# Patient Record
Sex: Female | Born: 1970 | Race: White | Hispanic: No | Marital: Married | Smoking: Never smoker
Health system: Southern US, Community
[De-identification: ages and names within clinical notes are randomized; demographics above are authoritative.]

## PROBLEM LIST (undated history)

## (undated) DIAGNOSIS — J189 Pneumonia, unspecified organism: Secondary | ICD-10-CM

## (undated) DIAGNOSIS — K529 Noninfective gastroenteritis and colitis, unspecified: Secondary | ICD-10-CM

## (undated) DIAGNOSIS — D369 Benign neoplasm, unspecified site: Secondary | ICD-10-CM

## (undated) DIAGNOSIS — J40 Bronchitis, not specified as acute or chronic: Secondary | ICD-10-CM

## (undated) DIAGNOSIS — I10 Essential (primary) hypertension: Secondary | ICD-10-CM

## (undated) DIAGNOSIS — E669 Obesity, unspecified: Secondary | ICD-10-CM

## (undated) DIAGNOSIS — J329 Chronic sinusitis, unspecified: Secondary | ICD-10-CM

## (undated) DIAGNOSIS — E039 Hypothyroidism, unspecified: Secondary | ICD-10-CM

## (undated) HISTORY — DX: Bronchitis, not specified as acute or chronic: J32.9

## (undated) HISTORY — PX: SHOULDER SURGERY: SHX246

## (undated) HISTORY — DX: Noninfective gastroenteritis and colitis, unspecified: K52.9

## (undated) HISTORY — DX: Benign neoplasm, unspecified site: D36.9

## (undated) HISTORY — PX: BREAST ENHANCEMENT SURGERY: SHX7

## (undated) HISTORY — DX: Hypothyroidism, unspecified: E03.9

## (undated) HISTORY — DX: Bronchitis, not specified as acute or chronic: J40

## (undated) HISTORY — DX: Pneumonia, unspecified organism: J18.9

## (undated) HISTORY — PX: TENDON RELEASE: SHX230

## (undated) HISTORY — DX: Obesity, unspecified: E66.9

## (undated) HISTORY — PX: OTHER SURGICAL HISTORY: SHX169

## (undated) HISTORY — PX: FINGER SURGERY: SHX640

## (undated) HISTORY — PX: KNEE SURGERY: SHX244

---

## 1998-04-07 ENCOUNTER — Other Ambulatory Visit: Admission: RE | Admit: 1998-04-07 | Discharge: 1998-04-07 | Payer: Self-pay | Admitting: Obstetrics and Gynecology

## 1998-05-13 ENCOUNTER — Ambulatory Visit (HOSPITAL_COMMUNITY): Admission: RE | Admit: 1998-05-13 | Discharge: 1998-05-13 | Payer: Self-pay | Admitting: Sports Medicine

## 1998-05-13 ENCOUNTER — Encounter: Payer: Self-pay | Admitting: Sports Medicine

## 1998-09-01 ENCOUNTER — Other Ambulatory Visit: Admission: RE | Admit: 1998-09-01 | Discharge: 1998-09-01 | Payer: Self-pay | Admitting: Obstetrics and Gynecology

## 1998-10-12 ENCOUNTER — Other Ambulatory Visit: Admission: RE | Admit: 1998-10-12 | Discharge: 1998-10-12 | Payer: Self-pay | Admitting: Obstetrics and Gynecology

## 1999-04-13 ENCOUNTER — Other Ambulatory Visit: Admission: RE | Admit: 1999-04-13 | Discharge: 1999-04-13 | Payer: Self-pay | Admitting: Obstetrics and Gynecology

## 1999-07-15 ENCOUNTER — Other Ambulatory Visit: Admission: RE | Admit: 1999-07-15 | Discharge: 1999-07-15 | Payer: Self-pay | Admitting: Obstetrics and Gynecology

## 1999-10-12 ENCOUNTER — Other Ambulatory Visit: Admission: RE | Admit: 1999-10-12 | Discharge: 1999-10-12 | Payer: Self-pay | Admitting: Obstetrics and Gynecology

## 2000-04-12 ENCOUNTER — Other Ambulatory Visit: Admission: RE | Admit: 2000-04-12 | Discharge: 2000-04-12 | Payer: Self-pay | Admitting: Obstetrics and Gynecology

## 2000-10-04 ENCOUNTER — Other Ambulatory Visit: Admission: RE | Admit: 2000-10-04 | Discharge: 2000-10-04 | Payer: Self-pay | Admitting: Obstetrics and Gynecology

## 2001-04-05 ENCOUNTER — Other Ambulatory Visit: Admission: RE | Admit: 2001-04-05 | Discharge: 2001-04-05 | Payer: Self-pay | Admitting: Obstetrics and Gynecology

## 2001-10-03 ENCOUNTER — Other Ambulatory Visit: Admission: RE | Admit: 2001-10-03 | Discharge: 2001-10-03 | Payer: Self-pay | Admitting: *Deleted

## 2002-10-14 ENCOUNTER — Other Ambulatory Visit: Admission: RE | Admit: 2002-10-14 | Discharge: 2002-10-14 | Payer: Self-pay | Admitting: *Deleted

## 2003-10-23 ENCOUNTER — Other Ambulatory Visit: Admission: RE | Admit: 2003-10-23 | Discharge: 2003-10-23 | Payer: Self-pay | Admitting: *Deleted

## 2004-06-03 ENCOUNTER — Ambulatory Visit: Payer: Self-pay | Admitting: Internal Medicine

## 2004-10-26 ENCOUNTER — Other Ambulatory Visit: Admission: RE | Admit: 2004-10-26 | Discharge: 2004-10-26 | Payer: Self-pay | Admitting: *Deleted

## 2005-08-04 ENCOUNTER — Ambulatory Visit: Payer: Self-pay | Admitting: Internal Medicine

## 2005-08-10 ENCOUNTER — Ambulatory Visit: Payer: Self-pay | Admitting: Internal Medicine

## 2007-05-09 ENCOUNTER — Ambulatory Visit: Payer: Self-pay | Admitting: Internal Medicine

## 2007-07-06 DIAGNOSIS — J45991 Cough variant asthma: Secondary | ICD-10-CM | POA: Insufficient documentation

## 2007-07-06 DIAGNOSIS — J3089 Other allergic rhinitis: Secondary | ICD-10-CM

## 2007-07-06 DIAGNOSIS — J302 Other seasonal allergic rhinitis: Secondary | ICD-10-CM | POA: Insufficient documentation

## 2007-07-09 ENCOUNTER — Ambulatory Visit: Payer: Self-pay | Admitting: Internal Medicine

## 2007-07-10 ENCOUNTER — Encounter: Admission: RE | Admit: 2007-07-10 | Discharge: 2007-07-10 | Payer: Self-pay | Admitting: Gastroenterology

## 2007-07-16 ENCOUNTER — Encounter: Payer: Self-pay | Admitting: Internal Medicine

## 2007-07-31 ENCOUNTER — Ambulatory Visit: Payer: Self-pay | Admitting: Internal Medicine

## 2007-08-24 ENCOUNTER — Ambulatory Visit: Payer: Self-pay | Admitting: Internal Medicine

## 2007-09-07 ENCOUNTER — Ambulatory Visit: Payer: Self-pay | Admitting: Internal Medicine

## 2007-09-17 ENCOUNTER — Ambulatory Visit: Payer: Self-pay | Admitting: Internal Medicine

## 2007-10-16 ENCOUNTER — Ambulatory Visit: Payer: Self-pay | Admitting: Internal Medicine

## 2008-01-22 ENCOUNTER — Ambulatory Visit: Payer: Self-pay | Admitting: Internal Medicine

## 2008-01-24 ENCOUNTER — Ambulatory Visit: Payer: Self-pay | Admitting: Internal Medicine

## 2008-05-15 ENCOUNTER — Ambulatory Visit (HOSPITAL_COMMUNITY): Admission: RE | Admit: 2008-05-15 | Discharge: 2008-05-15 | Payer: Self-pay | Admitting: Gastroenterology

## 2008-06-16 ENCOUNTER — Ambulatory Visit: Payer: Self-pay | Admitting: Internal Medicine

## 2008-07-24 ENCOUNTER — Ambulatory Visit: Payer: Self-pay | Admitting: Internal Medicine

## 2008-11-18 ENCOUNTER — Ambulatory Visit: Payer: Self-pay | Admitting: Internal Medicine

## 2009-06-24 ENCOUNTER — Ambulatory Visit: Payer: Self-pay | Admitting: Internal Medicine

## 2009-08-20 ENCOUNTER — Ambulatory Visit: Payer: Self-pay | Admitting: Internal Medicine

## 2010-04-02 ENCOUNTER — Telehealth (INDEPENDENT_AMBULATORY_CARE_PROVIDER_SITE_OTHER): Payer: Self-pay | Admitting: *Deleted

## 2010-04-06 ENCOUNTER — Ambulatory Visit: Payer: Self-pay | Admitting: Internal Medicine

## 2010-07-29 NOTE — Assessment & Plan Note (Signed)
Summary: ROV//1 YEAR/MBW   Primary Provider/Referring Provider:  Hildred Chase  CC:  follow up visit.  History of Present Illness: ASTHMA (ICD-493.90) ASTHMA, INTERMITTENT, MILD (ICD-493.90) ALLERGIC RHINITIS (ICD-477.9)  7/090 40 YO woman returning for f/u of asthma and allergic rhinitis. She gets her allergy vaccine injections at work 1:50, with no problems.Using Veramyst. Patanase is too harsh on her nose. Asthma control has been good with no exacerbations.  07/24/08- Asthma, allergic rhinitis Moved up to 1:10 on vaccine with shots still given at work- doing very well. Has had good 2-3 months. No colds this winter. Had both flu vax. Discussed saline nasal spray, conversion to 3 month mail order scripts, and rescue inhalers.  August 20, 2009- Asthma, allergic rhinitis Father Barbee Cough died at home- COPD and DM. Allergy vacine continues to help, given 1:10 at work. No problems. Has taken Sudafed last few days for stuffiness. Rarely needs Proair. Had flu shot.   Current Medications (verified): 1)  Apri Birth Control .Marland Kitchen.. 1 By Mouth Daily 2)  Singulair 10 Mg  Tabs (Montelukast Sodium) .Marland Kitchen.. 1 By Mouth Daily 3)  Multivitamins   Tabs (Multiple Vitamin) .Marland Kitchen.. 1 By Mouth Daily 4)  Allegra 180 Mg  Tabs (Fexofenadine Hcl) .Marland Kitchen.. 1 By Mouth Daily 5)  Epipen 2-Pak 0.3 Mg/0.2ml (1:1000)  Devi (Epinephrine Hcl (Anaphylaxis)) .... For Severe Allergic Reaction 6)  Allergy Vaccine Go To 1:10, At Work 7)  Colestipol Hcl 1 Gm Tabs (Colestipol Hcl) .... Two Times A Day 8)  Proair Hfa 108 (90 Base) Mcg/act Aers (Albuterol Sulfate) .... 2 Puffs Four Times A Day As Needed  Allergies (verified): 1)  ! Codeine 2)  * Noxema  Past History:  Past Medical History: Last updated: 07/09/2007 Allergic Rhinitis Asthma Pneumonia x2 chronic recurrent sinusitis  Past Surgical History: Last updated: 07/09/2007 breast augmentation R knee surgery tendon release R hand R shoulder  Family History: Last  updated: 08/20/2009 Mother - had lung cancer Father died asthma/emphysema, DM  Social History: Last updated: 07/24/2008 Patient never smoked. Grew up in smoking household  Risk Factors: Smoking Status: never (07/24/2008)  Family History: Mother - had lung cancer Father died asthma/emphysema, DM  Review of Systems      See HPI  The patient denies anorexia, fever, weight loss, weight gain, vision loss, decreased hearing, hoarseness, chest pain, syncope, dyspnea on exertion, peripheral edema, prolonged cough, headaches, hemoptysis, abdominal pain, and severe indigestion/heartburn.    Vital Signs:  Patient profile:   40 year old female Height:      63.5 inches Weight:      155.38 pounds BMI:     27.19 O2 Sat:      99 % on Room air Pulse rate:   111 / minute BP sitting:   122 / 80  (left arm) Cuff size:   regular  Vitals Entered By: Reynaldo Minium CMA (August 20, 2009 2:56 PM)  O2 Flow:  Room air  Physical Exam  Additional Exam:  General: A/Ox3; pleasant and cooperative, NAD, well-appearing SKIN: no rash, lesions NODES: no lymphadenopathy HEENT: [place]/AT, EOM- WNL, Conjuctivae- clear, PERRLA, TM-WNL, Nose- clear, Throat- clear and wnl, sniffing and minor hoarseness, not red [place]  II NECK: Supple w/ fair ROM, JVD- none, normal carotid impulses w/o bruits Thyroid- normal to palpation CHEST: Clear to P&A HEART: RRR, no m/g/r heard, pulse runs a little rapid- again noted ABDOMEN: Soft and nl;  WUJ:WJXB, nl pulses, no edema  NEURO: Grossly intact to observation  Impression & Recommendations:  Problem # 1:  ASTHMA, INTERMITTENT, MILD (ICD-493.90) Rescue inhaler use is appropriate and sufficient  Problem # 2:  ALLERGIC RHINITIS (ICD-477.9)  She believes vaccine is worth continuing. We discussed supplemental meds available if needed. Allegra is going otc. Her updated medication list for this problem includes:    Allegra 180 Mg Tabs (Fexofenadine hcl) .Marland Kitchen... 1  by mouth daily  Medications Added to Medication List This Visit: 1)  Epipen 0.3 Mg/0.52ml Devi (Epinephrine) .... For severe allergic reaction  Other Orders: Est. Patient Level III (16109)  Patient Instructions: 1)  Schedule return in one year, earlier if needed 2)  Meds refilled 3)  continue allergy vaccine Prescriptions: EPIPEN 0.3 MG/0.3ML DEVI (EPINEPHRINE) For severe allergic reaction  #1 x prn   Entered and Authorized by:   Waymon Budge MD   Signed by:   Waymon Budge MD on 08/20/2009   Method used:   Print then Give to Patient   RxID:   [id number] ALLEGRA 180 MG  TABS (FEXOFENADINE HCL) 1 by mouth daily  #90 x 3   Entered and Authorized by:   Waymon Budge MD   Signed by:   Waymon Budge MD on 08/20/2009   Method used:   Print then Give to Patient   RxID:   [id number] SINGULAIR 10 MG  TABS (MONTELUKAST SODIUM) 1 by mouth daily  #90 x 3   Entered and Authorized by:   Waymon Budge MD   Signed by:   Waymon Budge MD on 08/20/2009   Method used:   Print then Give to Patient   RxID:   [id number]

## 2010-07-29 NOTE — Progress Notes (Signed)
Summary: allergy problem  Phone Note Call from Patient Call back at [phone] ex [phone]   Caller: Patient Call For: young Summary of Call: need to talk to nurse about allergy problem. Initial call taken by: Rickard Patience,  April 02, 2010 11:08 AM  Follow-up for Phone Call        called and spoke with pt.  pt states she received a call the first of this week from the allergy lab regarding her serum.  Will forward message to the allergy lab.  Aundra Millet Reynolds LPN  April 02, 2010 11:33 AM   Additional Follow-up for Phone Call Additional follow up Details #1::        Talked to pt. this morning;her last shot was a week ago.Her last vac. was 06-24-09. She didn't start taking it till 09-14-2009;indicating we sent more vac. in early March. We have no record of this. Do want [place] to start her at a lower strength & dose or just a lower dose? She is currently taking 0.5 of 1:10.  Additional Follow-up by: Dimas Millin,  April 05, 2010 10:46 AM    Additional Follow-up for Phone Call Additional follow up Details #2::    I would have her stay on 1:10, and 1 week intervals. Drop down to 0.1 ml/ vial and rebuild from that.  Follow-up by: Waymon Budge MD,  April 05, 2010 12:24 PM

## 2010-08-20 ENCOUNTER — Encounter: Payer: Self-pay | Admitting: Internal Medicine

## 2010-08-20 ENCOUNTER — Ambulatory Visit (INDEPENDENT_AMBULATORY_CARE_PROVIDER_SITE_OTHER): Payer: BC Managed Care – PPO | Admitting: Internal Medicine

## 2010-08-20 DIAGNOSIS — J309 Allergic rhinitis, unspecified: Secondary | ICD-10-CM

## 2010-08-20 DIAGNOSIS — J45909 Unspecified asthma, uncomplicated: Secondary | ICD-10-CM

## 2010-09-02 NOTE — Assessment & Plan Note (Signed)
Summary: 1 year//mbw   Primary Provider/Referring Provider:  Hildred Alamin  CC:  Follow up visit-asthma and allergies; Recent Bronchitis-cough non productive still hanging around..  History of Present Illness:  07/24/08- Asthma, allergic rhinitis Moved up to 1:10 on vaccine with shots still given at work- doing very well. Has had good 2-3 months. No colds this winter. Had both flu vax. Discussed saline nasal spray, conversion to 3 month mail order scripts, and rescue inhalers.  August 20, 2009- Asthma, allergic rhinitis Father Barbee Cough died at home- COPD and DM. Allergy vacine continues to help, given 1:10 at work. No problems. Has taken Sudafed last few days for stuffiness. Rarely needs Proair. Had flu shot.  August 20, 2010- Asthma, allergic rhinitis Nurse-CC: Follow up visit-asthma and allergies; Recent Bronchitis-cough non productive still hanging around. Allergy vaccine 1:10 GO- given at work. 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 They help- fewer sinus infections and related problems. Uses nasal saline rinse when needed.  Asthma- minimal nagging cough since sick a month ago. Maybe some postnasal drip.       Asthma History    Initial Asthma Severity Rating:    Age range: 12+ years    Symptoms: 0-2 days/week    Nighttime Awakenings: 0-2/month    Interferes w/ normal activity: no limitations    SABA use (not for EIB): 0-2 days/week    Asthma Severity Assessment: Intermittent   Preventive Screening-Counseling & Management  Alcohol-Tobacco     Smoking Status: never  Current Medications (verified): 1)  Apri Birth Control .Marland Kitchen.. 1 By Mouth Daily 2)  Singulair 10 Mg  Tabs (Montelukast Sodium) .Marland Kitchen.. 1 By Mouth Daily 3)  Multivitamins   Tabs (Multiple Vitamin) .Marland Kitchen.. 1 By Mouth Daily 4)  Allegra 180 Mg  Tabs (Fexofenadine Hcl) .Marland Kitchen.. 1 By Mouth Daily 5)  Epipen 0.3 Mg/0.40ml Devi (Epinephrine) .... For Severe Allergic  Reaction 6)  Allergy Vaccine Go To 1:10, At Work 7)  Proair Hfa 108 (90 Base) Mcg/act Aers (Albuterol Sulfate) .... 2 Puffs Four Times A Day As Needed  Allergies (verified): 1)  ! Codeine 2)  * Noxema  Past History:  Past Medical History: Last updated: 07/09/2007 Allergic Rhinitis Asthma Pneumonia x2 chronic recurrent sinusitis  Past Surgical History: Last updated: 07/09/2007 breast augmentation R knee surgery tendon release R hand R shoulder  Family History: Last updated: 08/20/2009 Mother - had lung cancer Father died asthma/emphysema, DM  Social History: Last updated: 07/24/2008 Patient never smoked. Grew up in smoking household  Risk Factors: Smoking Status: never (08/20/2010)  Review of Systems      See HPI       The patient complains of nasal congestion/difficulty breathing through nose and sneezing.  The patient denies shortness of breath with activity, shortness of breath at rest, productive cough, non-productive cough, coughing up blood, chest pain, irregular heartbeats, acid heartburn, indigestion, loss of appetite, weight change, abdominal pain, difficulty swallowing, sore throat, tooth/dental problems, and headaches.    Vital Signs:  Patient profile:   40 year old female Height:      63.5 inches Weight:      169.25 pounds BMI:     29.62 O2 Sat:      99 % on Room air Pulse rate:   116 / minute BP sitting:   136 / 90  (right arm) Cuff size:   regular  Vitals Entered By: Reynaldo Minium CMA (August 20, 2010 2:18 PM)  O2 Flow:  Room air CC: Follow up visit-asthma and allergies; Recent Bronchitis-cough non productive still hanging around.   Physical Exam  Additional Exam:  General: A/Ox3; pleasant and cooperative, NAD, well-appearing SKIN: no rash, lesions NODES: no lymphadenopathy HEENT: [place]/AT, EOM- WNL, Conjuctivae- clear, PERRLA, TM-hearing aids, Nose- clear, Throat- clear and wnl, sniffing and minor hoarseness,   Mallampati  II, some red and  cobblestoned. NECK: Supple w/ fair ROM, JVD- none, normal carotid impulses w/o bruits Thyroid- normal to palpation CHEST: Clear to P&A HEART: RRR, no m/g/r heard, pulse runs a little rapid- again noted ABDOMEN: Soft and nl;  JWJ:XBJY, nl pulses, no edema  NEURO: Grossly intact to observation      Impression & Recommendations:  Problem # 1:  ALLERGIC RHINITIS (ICD-477.9)  Fairly good control for the season. We discussed choices among antihistamines. She will continue allergy shots.  Some of what she interprets in her throat as postnasal drip may be irritation from reflux. she has recently begun Zantac.  Her updated medication list for this problem includes:    Allegra 180 Mg Tabs (Fexofenadine hcl) .Marland Kitchen... 1 by mouth daily  Orders: Est. Patient Level III (78295)  Problem # 2:  ASTHMA (ICD-493.90) Good control   Patient Instructions: 1)  Please schedule a follow-up appointment in 1 year. 2)  continue allergy vaccine- please call if any questions or concerns 3)  Ok to use antihistamine of choice 4)  Meds refilled Prescriptions: PROAIR HFA 108 (90 BASE) MCG/ACT AERS (ALBUTEROL SULFATE) 2 puffs four times a day as needed  #1 x prn   Entered and Authorized by:   Waymon Budge MD   Signed by:   Waymon Budge MD on 08/20/2010   Method used:   Print then Give to Patient   RxID:   [id number] EPIPEN 0.3 MG/0.3ML DEVI (EPINEPHRINE) For severe allergic reaction  #1 x prn   Entered and Authorized by:   Waymon Budge MD   Signed by:   Waymon Budge MD on 08/20/2010   Method used:   Print then Give to Patient   RxID:   [id number] SINGULAIR 10 MG  TABS (MONTELUKAST SODIUM) 1 by mouth daily  #90 x 3   Entered and Authorized by:   Waymon Budge MD   Signed by:   Waymon Budge MD on 08/20/2010   Method used:   Print then Give to Patient   RxID:   [id number]

## 2010-11-09 NOTE — Assessment & Plan Note (Signed)
[facility]                             PULMONARY OFFICE NOTE   Evelyn, Chase                     MRN:          [id number]  DATE:05/09/2007                            DOB:          30-Oct-1970    PROBLEM:  A 40 year old woman with a longstanding history of allergic  rhinitis and asthma wanting to establish here.  She was on allergy  vaccine for about 7 years with Dr. Lucie Leather and then off for about a year  during which time she began again having recurrent sinus congestion and  sinus infections.  She then transferred to Dr. Adela Lank for 3 years.  Her  office nurse has been giving her shots.  She has an EpiPen at work.  There have been no problems with reactions to her vaccine.  Most recent  skin testing about 3 or 4 years ago she remembers positive for molds,  cat, and dust.  She notes seasonal rhinorrhea and nasal congestion,  spring and fall, with postnasal drip which triggers cough.  Metered  inhaler has been some help.  Her most significant trigger has been house  dust recently.  Occasional need for albuterol, maybe 3 or 4 times a  month.   MEDICATIONS:  1. Loestrin birth control pills.  2. Allergy vaccine from Dr. Valinda Party office.  3. Singulair 10 mg.  4. Detrol LA 4 mg.  5. Allegra 180 mg.  6. Patanase nasal spray.  7. Ambien p.r.n.  8. Albuterol inhaler p.r.n.   ALLERGIES:  DRUG INTOLERANT TO CODEINE.   REVIEW OF SYSTEMS:  Exertional dyspnea, nonproductive cough, sore  throats, headaches, nasal congestion especially at night, sneezing,  little reflux.   PAST MEDICAL HISTORY:  1. Two episodes of pneumonia.  2. No ENT surgery.  3. Asthma.  4. Allergic rhinitis.  5. Chronic headaches.  6. Childhood otitis.  7. No history of urticaria, but she avoids Noxema which makes her skin      flush.  8. Breast augmentation in 2005.  9. Bilateral TM joint problems.  10.Right knee surgery.  11.Tenon release on right thumb and ring  finger.  12.Surgery on right shoulder.  13.Wisdom teeth extraction.   No intolerance to latex, contrast dye, or aspirin, but she avoids MSG in  restaurant food which causes GI upset.  Otherwise, no food or insect  sting sensitivity.   SOCIAL HISTORY:  Never smoked.  Occasional social alcohol.  Husband  smokes outside.  No children.  She works as a Psychologist, sport and exercise.   FAMILY HISTORY:  Father with allergies, asthma, and emphysema.  He  smoked.  Mother with lung cancer.   ENVIRONMENTAL:  She lives in a house with a crawl space, heat pump, one  dog.  There are  on bedding.  Carpet in bedroom is to be removed;  otherwise, hardwood.  They have an air cleaner in the bedroom.   OBJECTIVE:  VITAL SIGNS:  Weight 154 pounds.  Blood pressure 116/80,  pulse 87 and regular, room air saturation 87%.  HEENT:  Sniffing.  Narrow nasal airway with some mucous bridging.  No  visible polyps.  Pharynx is clear.  Voice quality normal.  NECK:  No neck vein distension or stridor.  CHEST:  Clear without cough or wheeze.  Good diaphragmatic excursion.  CARDIAC:  Heart sounds regular without murmur or gallop.  There is no  hepatosplenomegaly, no adenopathy or edema.   IMPRESSION:  1. Allergic rhinitis.  2. Mild intermittent asthma.   PLAN:  Add Veramyst nasal spray once or twice daily.  She will return as  able off of antihistamines for skin testing for her wish to transfer her  allergy vaccine management here.     Clinton D. Maple Hudson, MD, Tonny Bollman, FACP  Electronically Signed    CDY/MedQ  DD: 05/13/2007  DT: 05/14/2007  Job #: [id number]   cc:   Tammy R. Collins [place], M.D.

## 2010-11-09 NOTE — Op Note (Signed)
NAME:  Evelyn Chase, Evelyn Chase              ACCOUNT NO.:  [id number]   MEDICAL RECORD NO.:  [id number]          PATIENT TYPE:  AMB   LOCATION:  ENDO                         FACILITY:  [facility]   PHYSICIAN:  Petra Kuba, M.D.    DATE OF BIRTH:  February 16, 1971   DATE OF PROCEDURE:  DATE OF DISCHARGE:                               OPERATIVE REPORT   PROCEDURE:  Colonoscopy with biopsy.   INDICATION:  Patient with chronic diarrhea, nondiagnostic biopsies in  the past, but also with a polyp with high-grade dysplasia.  I want to  repeat colonoscopy for screening.  Consent was signed after risks,  benefits, methods, options thoroughly discussed multiple times in the  past.   MEDICINES USED PER ANESTHESIA:  Diprivan 225, Versed 2 mg, fentanyl 250  mcg.   PROCEDURE:  Rectal inspection is pertinent for small external  hemorrhoids.  Digital exam was negative.  The video pediatric  colonoscope was inserted with some difficulty due to a tortuous looping  colon.  With abdominal pressure, it was able to be advanced into the  cecum which was identified by the appendiceal orifice and ileocecal  valve.  No abnormalities were seen on insertion.  The scope was inserted  a short way into the terminal ileum which was normal.  Photo  documentation was obtained.  The scope was slowly withdrawn.  On slow  withdrawal through the colon, no polyps, tumors, masses, diverticula, or  signs of colitis were seen.  As we slowly withdrew back to the rectum,  anorectal pull-through and retroflexion confirmed some small  hemorrhoids.  The scope was straightened and readvanced towards the left  side of the colon.  Air was suctioned and the scope removed.  The  patient tolerated the procedure well.  There was no obvious immediate  complication.   ENDOSCOPIC DIAGNOSES:  1. Internal and external hemorrhoids.  2. Tortuous colon.  3. Otherwise within normal limits to the terminal ileum.   PLAN:  Recheck colon screening in 3 years.   Continue present management.  She seems to be improved.  Follow up p.r.n. or in 3 months.           ______________________________  Petra Kuba, M.D.     MEM/MEDQ  D:  05/15/2008  T:  05/15/2008  Job:  [id number]   cc:   Tammy R. Collins [place], M.D.  Fax: [phone]

## 2010-11-29 ENCOUNTER — Ambulatory Visit (INDEPENDENT_AMBULATORY_CARE_PROVIDER_SITE_OTHER): Payer: BC Managed Care – PPO

## 2010-11-29 DIAGNOSIS — J309 Allergic rhinitis, unspecified: Secondary | ICD-10-CM

## 2010-12-20 ENCOUNTER — Ambulatory Visit (INDEPENDENT_AMBULATORY_CARE_PROVIDER_SITE_OTHER): Payer: BC Managed Care – PPO

## 2010-12-20 DIAGNOSIS — J309 Allergic rhinitis, unspecified: Secondary | ICD-10-CM

## 2010-12-27 ENCOUNTER — Ambulatory Visit (INDEPENDENT_AMBULATORY_CARE_PROVIDER_SITE_OTHER): Payer: BC Managed Care – PPO

## 2010-12-27 DIAGNOSIS — J309 Allergic rhinitis, unspecified: Secondary | ICD-10-CM

## 2011-01-04 ENCOUNTER — Ambulatory Visit (INDEPENDENT_AMBULATORY_CARE_PROVIDER_SITE_OTHER): Payer: BC Managed Care – PPO

## 2011-01-04 DIAGNOSIS — J309 Allergic rhinitis, unspecified: Secondary | ICD-10-CM

## 2011-01-10 ENCOUNTER — Ambulatory Visit (INDEPENDENT_AMBULATORY_CARE_PROVIDER_SITE_OTHER): Payer: BC Managed Care – PPO

## 2011-01-10 DIAGNOSIS — J309 Allergic rhinitis, unspecified: Secondary | ICD-10-CM

## 2011-01-18 ENCOUNTER — Ambulatory Visit (INDEPENDENT_AMBULATORY_CARE_PROVIDER_SITE_OTHER): Payer: BC Managed Care – PPO

## 2011-01-18 DIAGNOSIS — J309 Allergic rhinitis, unspecified: Secondary | ICD-10-CM

## 2011-01-25 ENCOUNTER — Ambulatory Visit (INDEPENDENT_AMBULATORY_CARE_PROVIDER_SITE_OTHER): Payer: BC Managed Care – PPO

## 2011-01-25 DIAGNOSIS — J309 Allergic rhinitis, unspecified: Secondary | ICD-10-CM

## 2011-01-31 ENCOUNTER — Ambulatory Visit (INDEPENDENT_AMBULATORY_CARE_PROVIDER_SITE_OTHER): Payer: BC Managed Care – PPO

## 2011-01-31 DIAGNOSIS — J309 Allergic rhinitis, unspecified: Secondary | ICD-10-CM

## 2011-02-07 ENCOUNTER — Ambulatory Visit (INDEPENDENT_AMBULATORY_CARE_PROVIDER_SITE_OTHER): Payer: BC Managed Care – PPO

## 2011-02-07 DIAGNOSIS — J309 Allergic rhinitis, unspecified: Secondary | ICD-10-CM

## 2011-02-14 ENCOUNTER — Ambulatory Visit (INDEPENDENT_AMBULATORY_CARE_PROVIDER_SITE_OTHER): Payer: BC Managed Care – PPO

## 2011-02-14 DIAGNOSIS — J309 Allergic rhinitis, unspecified: Secondary | ICD-10-CM

## 2011-02-21 ENCOUNTER — Ambulatory Visit (INDEPENDENT_AMBULATORY_CARE_PROVIDER_SITE_OTHER): Payer: BC Managed Care – PPO

## 2011-02-21 DIAGNOSIS — J309 Allergic rhinitis, unspecified: Secondary | ICD-10-CM

## 2011-03-01 ENCOUNTER — Ambulatory Visit (INDEPENDENT_AMBULATORY_CARE_PROVIDER_SITE_OTHER): Payer: BC Managed Care – PPO

## 2011-03-01 DIAGNOSIS — J309 Allergic rhinitis, unspecified: Secondary | ICD-10-CM

## 2011-03-10 ENCOUNTER — Ambulatory Visit (INDEPENDENT_AMBULATORY_CARE_PROVIDER_SITE_OTHER): Payer: BC Managed Care – PPO

## 2011-03-10 DIAGNOSIS — J309 Allergic rhinitis, unspecified: Secondary | ICD-10-CM

## 2011-03-17 ENCOUNTER — Ambulatory Visit (INDEPENDENT_AMBULATORY_CARE_PROVIDER_SITE_OTHER): Payer: BC Managed Care – PPO

## 2011-03-17 DIAGNOSIS — J309 Allergic rhinitis, unspecified: Secondary | ICD-10-CM

## 2011-03-21 ENCOUNTER — Ambulatory Visit (INDEPENDENT_AMBULATORY_CARE_PROVIDER_SITE_OTHER): Payer: BC Managed Care – PPO

## 2011-03-21 DIAGNOSIS — J309 Allergic rhinitis, unspecified: Secondary | ICD-10-CM

## 2011-03-25 ENCOUNTER — Encounter: Payer: Self-pay | Admitting: Internal Medicine

## 2011-03-28 ENCOUNTER — Ambulatory Visit (INDEPENDENT_AMBULATORY_CARE_PROVIDER_SITE_OTHER): Payer: BC Managed Care – PPO

## 2011-03-28 DIAGNOSIS — J309 Allergic rhinitis, unspecified: Secondary | ICD-10-CM

## 2011-03-30 LAB — HEMOGLOBIN AND HEMATOCRIT, BLOOD
HCT: 45.3
Hemoglobin: 15.3 — ABNORMAL HIGH

## 2011-04-04 ENCOUNTER — Ambulatory Visit (INDEPENDENT_AMBULATORY_CARE_PROVIDER_SITE_OTHER): Payer: BC Managed Care – PPO

## 2011-04-04 DIAGNOSIS — J309 Allergic rhinitis, unspecified: Secondary | ICD-10-CM

## 2011-04-11 ENCOUNTER — Ambulatory Visit (INDEPENDENT_AMBULATORY_CARE_PROVIDER_SITE_OTHER): Payer: BC Managed Care – PPO

## 2011-04-11 DIAGNOSIS — J309 Allergic rhinitis, unspecified: Secondary | ICD-10-CM

## 2011-04-18 ENCOUNTER — Ambulatory Visit (INDEPENDENT_AMBULATORY_CARE_PROVIDER_SITE_OTHER): Payer: BC Managed Care – PPO

## 2011-04-18 DIAGNOSIS — J309 Allergic rhinitis, unspecified: Secondary | ICD-10-CM

## 2011-04-20 ENCOUNTER — Ambulatory Visit (INDEPENDENT_AMBULATORY_CARE_PROVIDER_SITE_OTHER): Payer: BC Managed Care – PPO

## 2011-04-20 DIAGNOSIS — J309 Allergic rhinitis, unspecified: Secondary | ICD-10-CM

## 2011-05-04 ENCOUNTER — Ambulatory Visit (INDEPENDENT_AMBULATORY_CARE_PROVIDER_SITE_OTHER): Payer: BC Managed Care – PPO

## 2011-05-04 DIAGNOSIS — J309 Allergic rhinitis, unspecified: Secondary | ICD-10-CM

## 2011-05-16 ENCOUNTER — Ambulatory Visit (INDEPENDENT_AMBULATORY_CARE_PROVIDER_SITE_OTHER): Payer: BC Managed Care – PPO

## 2011-05-16 DIAGNOSIS — J309 Allergic rhinitis, unspecified: Secondary | ICD-10-CM

## 2011-05-24 ENCOUNTER — Ambulatory Visit (INDEPENDENT_AMBULATORY_CARE_PROVIDER_SITE_OTHER): Payer: BC Managed Care – PPO

## 2011-05-24 DIAGNOSIS — J309 Allergic rhinitis, unspecified: Secondary | ICD-10-CM

## 2011-06-03 ENCOUNTER — Ambulatory Visit (INDEPENDENT_AMBULATORY_CARE_PROVIDER_SITE_OTHER): Payer: BC Managed Care – PPO

## 2011-06-03 DIAGNOSIS — J309 Allergic rhinitis, unspecified: Secondary | ICD-10-CM

## 2011-06-09 ENCOUNTER — Ambulatory Visit (INDEPENDENT_AMBULATORY_CARE_PROVIDER_SITE_OTHER): Payer: BC Managed Care – PPO

## 2011-06-09 DIAGNOSIS — J309 Allergic rhinitis, unspecified: Secondary | ICD-10-CM

## 2011-07-01 ENCOUNTER — Ambulatory Visit (INDEPENDENT_AMBULATORY_CARE_PROVIDER_SITE_OTHER): Payer: BC Managed Care – PPO

## 2011-07-01 DIAGNOSIS — J309 Allergic rhinitis, unspecified: Secondary | ICD-10-CM

## 2011-07-05 ENCOUNTER — Ambulatory Visit (INDEPENDENT_AMBULATORY_CARE_PROVIDER_SITE_OTHER): Payer: BC Managed Care – PPO

## 2011-07-05 DIAGNOSIS — J309 Allergic rhinitis, unspecified: Secondary | ICD-10-CM

## 2011-07-11 ENCOUNTER — Ambulatory Visit (INDEPENDENT_AMBULATORY_CARE_PROVIDER_SITE_OTHER): Payer: BC Managed Care – PPO

## 2011-07-11 DIAGNOSIS — J309 Allergic rhinitis, unspecified: Secondary | ICD-10-CM

## 2011-07-18 ENCOUNTER — Ambulatory Visit (INDEPENDENT_AMBULATORY_CARE_PROVIDER_SITE_OTHER): Payer: BC Managed Care – PPO

## 2011-07-18 DIAGNOSIS — J309 Allergic rhinitis, unspecified: Secondary | ICD-10-CM

## 2011-07-26 ENCOUNTER — Ambulatory Visit (INDEPENDENT_AMBULATORY_CARE_PROVIDER_SITE_OTHER): Payer: BC Managed Care – PPO

## 2011-07-26 DIAGNOSIS — J309 Allergic rhinitis, unspecified: Secondary | ICD-10-CM

## 2011-07-28 ENCOUNTER — Encounter: Payer: Self-pay | Admitting: Internal Medicine

## 2011-08-01 ENCOUNTER — Ambulatory Visit (INDEPENDENT_AMBULATORY_CARE_PROVIDER_SITE_OTHER): Payer: BC Managed Care – PPO

## 2011-08-01 DIAGNOSIS — J309 Allergic rhinitis, unspecified: Secondary | ICD-10-CM

## 2011-08-09 ENCOUNTER — Other Ambulatory Visit: Payer: Self-pay | Admitting: Internal Medicine

## 2011-08-10 ENCOUNTER — Ambulatory Visit (INDEPENDENT_AMBULATORY_CARE_PROVIDER_SITE_OTHER): Payer: BC Managed Care – PPO

## 2011-08-10 DIAGNOSIS — J309 Allergic rhinitis, unspecified: Secondary | ICD-10-CM

## 2011-08-19 ENCOUNTER — Encounter: Payer: Self-pay | Admitting: Internal Medicine

## 2011-08-22 ENCOUNTER — Ambulatory Visit (INDEPENDENT_AMBULATORY_CARE_PROVIDER_SITE_OTHER): Payer: BC Managed Care – PPO

## 2011-08-22 ENCOUNTER — Ambulatory Visit (INDEPENDENT_AMBULATORY_CARE_PROVIDER_SITE_OTHER): Payer: BC Managed Care – PPO | Admitting: Internal Medicine

## 2011-08-22 ENCOUNTER — Encounter: Payer: Self-pay | Admitting: Internal Medicine

## 2011-08-22 VITALS — BP 122/72 | HR 105 | Ht 64.0 in | Wt 180.0 lb

## 2011-08-22 DIAGNOSIS — J309 Allergic rhinitis, unspecified: Secondary | ICD-10-CM

## 2011-08-22 NOTE — Progress Notes (Signed)
08/22/11- 41 yoF never smoker followed for allergic rhinitis, asthma LOV-08/20/2010 Continues allergy vaccine here at 1:10 and believes it helps her. Uses Allegra and nasal saline rinse when needed. Recent nasal congestion is blamed on the weather. One episode of bronchitis in December. Has tried many different nasal steroid sprays.  ROS-see HPI Constitutional:   No-   weight loss, night sweats, fevers, chills, fatigue, lassitude. HEENT:   No-  headaches, difficulty swallowing, tooth/dental problems, sore throat,       No-  sneezing, itching, ear ache, +nasal congestion, post nasal drip,  CV:  No-   chest pain, orthopnea, PND, swelling in lower extremities, anasarca, dizziness, palpitations Resp: No-   shortness of breath with exertion or at rest.              No-   productive cough,  No non-productive cough,  No- coughing up of blood.              No-   change in color of mucus.  No- wheezing.   Skin: No-   rash or lesions. GI:  No-   heartburn, indigestion, abdominal pain, nausea, vomiting, diarrhea,                 change in bowel habits, loss of appetite GU:  MS:  No-   joint pain or swelling.  No- decreased range of motion.  No- back pain. Neuro-     nothing unusual Psych:  No- change in mood or affect. No depression or anxiety.  No memory loss.  OBJ- Physical Exam General- Alert, Oriented, Affect-appropriate, Distress- none acute Skin- rash-none, lesions- none, excoriation- none Lymphadenopathy- none Head- atraumatic            Eyes- Gross vision intact, PERRLA, conjunctivae and secretions clear            Ears- Hearing, canals-normal            Nose- Clear, no-Septal dev, mucus, polyps, erosion, perforation             Throat- Mallampati II , mucosa clear , drainage- none, tonsils- atrophic Neck- flexible , trachea midline, no stridor , thyroid nl, carotid no bruit Chest - symmetrical excursion , unlabored           Heart/CV- RRR , no murmur , no gallop  , no rub, nl s1 s2                      - JVD- none , edema- none, stasis changes- none, varices- none           Lung- clear to P&A, wheeze- none, cough- none , dullness-none, rub- none           Chest wall-  Abd-  Br/ Gen/ Rectal- Not done, not indicated Extrem- cyanosis- none, clubbing, none, atrophy- none, strength- nl Neuro- grossly intact to observation

## 2011-08-22 NOTE — Patient Instructions (Signed)
Try Singulair off and on, a week at a time. See if with that you can tell if it is worth using now.   Sample Patanase nasal antihistamine spray  1 or 2 puffs each nostril, up to twice daily when needed.

## 2011-08-26 NOTE — Assessment & Plan Note (Signed)
She is satisfied that allergy vaccine has been a significant benefit. She supplements as needed with Allegra, nasal saline and Singulair. Plan- try Patanase

## 2011-08-29 ENCOUNTER — Telehealth: Payer: Self-pay | Admitting: Internal Medicine

## 2011-08-29 ENCOUNTER — Ambulatory Visit (INDEPENDENT_AMBULATORY_CARE_PROVIDER_SITE_OTHER): Payer: BC Managed Care – PPO

## 2011-08-29 DIAGNOSIS — J309 Allergic rhinitis, unspecified: Secondary | ICD-10-CM

## 2011-08-29 MED ORDER — AZELASTINE-FLUTICASONE 137-50 MCG/ACT NA SUSP: 2.0000 | Freq: Every day | NASAL | Status: DC

## 2011-08-29 NOTE — Telephone Encounter (Signed)
Pt aware that RX at front for pick up-on her way to the office now to get.

## 2011-08-29 NOTE — Telephone Encounter (Signed)
Pt was given samples of dymista at last OV and is requesting rx be printed that she can pick up and send to mail order. Rx printed and placed on CY look-at. Carron Curie, CMA

## 2011-09-05 ENCOUNTER — Ambulatory Visit (INDEPENDENT_AMBULATORY_CARE_PROVIDER_SITE_OTHER): Payer: BC Managed Care – PPO

## 2011-09-05 DIAGNOSIS — J309 Allergic rhinitis, unspecified: Secondary | ICD-10-CM

## 2011-09-13 ENCOUNTER — Ambulatory Visit (INDEPENDENT_AMBULATORY_CARE_PROVIDER_SITE_OTHER): Payer: BC Managed Care – PPO

## 2011-09-13 DIAGNOSIS — J309 Allergic rhinitis, unspecified: Secondary | ICD-10-CM

## 2011-09-19 ENCOUNTER — Ambulatory Visit (INDEPENDENT_AMBULATORY_CARE_PROVIDER_SITE_OTHER): Payer: BC Managed Care – PPO

## 2011-09-19 DIAGNOSIS — J309 Allergic rhinitis, unspecified: Secondary | ICD-10-CM

## 2011-09-30 ENCOUNTER — Ambulatory Visit (INDEPENDENT_AMBULATORY_CARE_PROVIDER_SITE_OTHER): Payer: BC Managed Care – PPO

## 2011-09-30 DIAGNOSIS — J309 Allergic rhinitis, unspecified: Secondary | ICD-10-CM

## 2011-10-03 ENCOUNTER — Ambulatory Visit (INDEPENDENT_AMBULATORY_CARE_PROVIDER_SITE_OTHER): Payer: BC Managed Care – PPO

## 2011-10-03 DIAGNOSIS — J309 Allergic rhinitis, unspecified: Secondary | ICD-10-CM

## 2011-10-10 ENCOUNTER — Ambulatory Visit (INDEPENDENT_AMBULATORY_CARE_PROVIDER_SITE_OTHER): Payer: BC Managed Care – PPO

## 2011-10-10 DIAGNOSIS — J309 Allergic rhinitis, unspecified: Secondary | ICD-10-CM

## 2011-10-25 ENCOUNTER — Ambulatory Visit (INDEPENDENT_AMBULATORY_CARE_PROVIDER_SITE_OTHER): Payer: BC Managed Care – PPO

## 2011-10-25 DIAGNOSIS — J309 Allergic rhinitis, unspecified: Secondary | ICD-10-CM

## 2011-11-07 ENCOUNTER — Ambulatory Visit (INDEPENDENT_AMBULATORY_CARE_PROVIDER_SITE_OTHER): Payer: BC Managed Care – PPO

## 2011-11-07 DIAGNOSIS — J309 Allergic rhinitis, unspecified: Secondary | ICD-10-CM

## 2011-11-14 ENCOUNTER — Ambulatory Visit (INDEPENDENT_AMBULATORY_CARE_PROVIDER_SITE_OTHER): Payer: BC Managed Care – PPO

## 2011-11-14 DIAGNOSIS — J309 Allergic rhinitis, unspecified: Secondary | ICD-10-CM

## 2011-11-18 ENCOUNTER — Encounter: Payer: Self-pay | Admitting: Internal Medicine

## 2011-11-28 ENCOUNTER — Ambulatory Visit (INDEPENDENT_AMBULATORY_CARE_PROVIDER_SITE_OTHER): Payer: BC Managed Care – PPO

## 2011-11-28 DIAGNOSIS — J309 Allergic rhinitis, unspecified: Secondary | ICD-10-CM

## 2011-12-13 ENCOUNTER — Ambulatory Visit (INDEPENDENT_AMBULATORY_CARE_PROVIDER_SITE_OTHER): Payer: BC Managed Care – PPO

## 2011-12-13 DIAGNOSIS — J309 Allergic rhinitis, unspecified: Secondary | ICD-10-CM

## 2011-12-20 ENCOUNTER — Ambulatory Visit (INDEPENDENT_AMBULATORY_CARE_PROVIDER_SITE_OTHER): Payer: BC Managed Care – PPO

## 2011-12-20 DIAGNOSIS — J309 Allergic rhinitis, unspecified: Secondary | ICD-10-CM

## 2011-12-26 ENCOUNTER — Ambulatory Visit (INDEPENDENT_AMBULATORY_CARE_PROVIDER_SITE_OTHER): Payer: BC Managed Care – PPO

## 2011-12-26 DIAGNOSIS — J309 Allergic rhinitis, unspecified: Secondary | ICD-10-CM

## 2012-01-02 ENCOUNTER — Ambulatory Visit (INDEPENDENT_AMBULATORY_CARE_PROVIDER_SITE_OTHER): Payer: BC Managed Care – PPO

## 2012-01-02 DIAGNOSIS — J309 Allergic rhinitis, unspecified: Secondary | ICD-10-CM

## 2012-01-17 ENCOUNTER — Ambulatory Visit (INDEPENDENT_AMBULATORY_CARE_PROVIDER_SITE_OTHER): Payer: BC Managed Care – PPO

## 2012-01-17 DIAGNOSIS — J309 Allergic rhinitis, unspecified: Secondary | ICD-10-CM

## 2012-01-31 ENCOUNTER — Ambulatory Visit (INDEPENDENT_AMBULATORY_CARE_PROVIDER_SITE_OTHER): Payer: BC Managed Care – PPO

## 2012-01-31 DIAGNOSIS — J309 Allergic rhinitis, unspecified: Secondary | ICD-10-CM

## 2012-02-06 ENCOUNTER — Ambulatory Visit (INDEPENDENT_AMBULATORY_CARE_PROVIDER_SITE_OTHER): Payer: BC Managed Care – PPO

## 2012-02-06 DIAGNOSIS — J309 Allergic rhinitis, unspecified: Secondary | ICD-10-CM

## 2012-02-13 ENCOUNTER — Ambulatory Visit (INDEPENDENT_AMBULATORY_CARE_PROVIDER_SITE_OTHER): Payer: BC Managed Care – PPO

## 2012-02-13 DIAGNOSIS — J309 Allergic rhinitis, unspecified: Secondary | ICD-10-CM

## 2012-02-20 ENCOUNTER — Ambulatory Visit (INDEPENDENT_AMBULATORY_CARE_PROVIDER_SITE_OTHER): Payer: BC Managed Care – PPO

## 2012-02-20 DIAGNOSIS — J309 Allergic rhinitis, unspecified: Secondary | ICD-10-CM

## 2012-02-28 DIAGNOSIS — I10 Essential (primary) hypertension: Secondary | ICD-10-CM | POA: Insufficient documentation

## 2012-02-29 ENCOUNTER — Ambulatory Visit (INDEPENDENT_AMBULATORY_CARE_PROVIDER_SITE_OTHER): Payer: BC Managed Care – PPO

## 2012-02-29 DIAGNOSIS — J309 Allergic rhinitis, unspecified: Secondary | ICD-10-CM

## 2012-03-13 ENCOUNTER — Ambulatory Visit (INDEPENDENT_AMBULATORY_CARE_PROVIDER_SITE_OTHER): Payer: BC Managed Care – PPO

## 2012-03-13 DIAGNOSIS — J309 Allergic rhinitis, unspecified: Secondary | ICD-10-CM

## 2012-03-19 ENCOUNTER — Ambulatory Visit (INDEPENDENT_AMBULATORY_CARE_PROVIDER_SITE_OTHER): Payer: BC Managed Care – PPO

## 2012-03-19 DIAGNOSIS — J309 Allergic rhinitis, unspecified: Secondary | ICD-10-CM

## 2012-03-21 ENCOUNTER — Ambulatory Visit (INDEPENDENT_AMBULATORY_CARE_PROVIDER_SITE_OTHER): Payer: BC Managed Care – PPO

## 2012-03-21 DIAGNOSIS — J309 Allergic rhinitis, unspecified: Secondary | ICD-10-CM

## 2012-03-31 ENCOUNTER — Other Ambulatory Visit: Payer: Self-pay | Admitting: Internal Medicine

## 2012-04-02 ENCOUNTER — Ambulatory Visit (INDEPENDENT_AMBULATORY_CARE_PROVIDER_SITE_OTHER): Payer: BC Managed Care – PPO

## 2012-04-02 DIAGNOSIS — J309 Allergic rhinitis, unspecified: Secondary | ICD-10-CM

## 2012-04-10 ENCOUNTER — Ambulatory Visit (INDEPENDENT_AMBULATORY_CARE_PROVIDER_SITE_OTHER): Payer: BC Managed Care – PPO

## 2012-04-10 DIAGNOSIS — J309 Allergic rhinitis, unspecified: Secondary | ICD-10-CM

## 2012-04-25 ENCOUNTER — Ambulatory Visit (INDEPENDENT_AMBULATORY_CARE_PROVIDER_SITE_OTHER): Payer: BC Managed Care – PPO

## 2012-04-25 DIAGNOSIS — J309 Allergic rhinitis, unspecified: Secondary | ICD-10-CM

## 2012-05-09 ENCOUNTER — Ambulatory Visit (INDEPENDENT_AMBULATORY_CARE_PROVIDER_SITE_OTHER): Payer: BC Managed Care – PPO

## 2012-05-09 DIAGNOSIS — J309 Allergic rhinitis, unspecified: Secondary | ICD-10-CM

## 2012-05-14 ENCOUNTER — Ambulatory Visit (INDEPENDENT_AMBULATORY_CARE_PROVIDER_SITE_OTHER): Payer: BC Managed Care – PPO

## 2012-05-14 DIAGNOSIS — J309 Allergic rhinitis, unspecified: Secondary | ICD-10-CM

## 2012-05-21 ENCOUNTER — Ambulatory Visit (INDEPENDENT_AMBULATORY_CARE_PROVIDER_SITE_OTHER): Payer: BC Managed Care – PPO

## 2012-05-21 DIAGNOSIS — J309 Allergic rhinitis, unspecified: Secondary | ICD-10-CM

## 2012-06-04 ENCOUNTER — Ambulatory Visit (INDEPENDENT_AMBULATORY_CARE_PROVIDER_SITE_OTHER): Payer: BC Managed Care – PPO

## 2012-06-04 DIAGNOSIS — J309 Allergic rhinitis, unspecified: Secondary | ICD-10-CM

## 2012-06-12 ENCOUNTER — Ambulatory Visit (INDEPENDENT_AMBULATORY_CARE_PROVIDER_SITE_OTHER): Payer: BC Managed Care – PPO

## 2012-06-12 DIAGNOSIS — J309 Allergic rhinitis, unspecified: Secondary | ICD-10-CM

## 2012-06-18 ENCOUNTER — Ambulatory Visit (INDEPENDENT_AMBULATORY_CARE_PROVIDER_SITE_OTHER): Payer: BC Managed Care – PPO

## 2012-06-18 DIAGNOSIS — J309 Allergic rhinitis, unspecified: Secondary | ICD-10-CM

## 2012-07-09 ENCOUNTER — Ambulatory Visit (INDEPENDENT_AMBULATORY_CARE_PROVIDER_SITE_OTHER): Payer: BC Managed Care – PPO

## 2012-07-09 DIAGNOSIS — J309 Allergic rhinitis, unspecified: Secondary | ICD-10-CM

## 2012-07-16 ENCOUNTER — Ambulatory Visit (INDEPENDENT_AMBULATORY_CARE_PROVIDER_SITE_OTHER): Payer: BC Managed Care – PPO

## 2012-07-16 DIAGNOSIS — J309 Allergic rhinitis, unspecified: Secondary | ICD-10-CM

## 2012-07-23 ENCOUNTER — Ambulatory Visit: Payer: BC Managed Care – PPO

## 2012-07-30 ENCOUNTER — Ambulatory Visit: Payer: BC Managed Care – PPO

## 2012-07-31 ENCOUNTER — Ambulatory Visit (INDEPENDENT_AMBULATORY_CARE_PROVIDER_SITE_OTHER): Payer: BC Managed Care – PPO

## 2012-07-31 DIAGNOSIS — J309 Allergic rhinitis, unspecified: Secondary | ICD-10-CM

## 2012-08-06 ENCOUNTER — Ambulatory Visit: Payer: BC Managed Care – PPO

## 2012-08-08 ENCOUNTER — Encounter: Payer: Self-pay | Admitting: Internal Medicine

## 2012-08-13 ENCOUNTER — Ambulatory Visit: Payer: BC Managed Care – PPO

## 2012-08-20 ENCOUNTER — Ambulatory Visit: Payer: BC Managed Care – PPO

## 2012-08-21 ENCOUNTER — Ambulatory Visit (INDEPENDENT_AMBULATORY_CARE_PROVIDER_SITE_OTHER): Payer: BC Managed Care – PPO

## 2012-08-21 ENCOUNTER — Encounter: Payer: Self-pay | Admitting: Internal Medicine

## 2012-08-21 ENCOUNTER — Ambulatory Visit (INDEPENDENT_AMBULATORY_CARE_PROVIDER_SITE_OTHER): Payer: BC Managed Care – PPO | Admitting: Internal Medicine

## 2012-08-21 VITALS — BP 136/88 | HR 106 | Ht 64.0 in | Wt 173.4 lb

## 2012-08-21 DIAGNOSIS — J309 Allergic rhinitis, unspecified: Secondary | ICD-10-CM

## 2012-08-21 DIAGNOSIS — J4520 Mild intermittent asthma, uncomplicated: Secondary | ICD-10-CM

## 2012-08-21 DIAGNOSIS — J45909 Unspecified asthma, uncomplicated: Secondary | ICD-10-CM

## 2012-08-21 MED ORDER — ALBUTEROL SULFATE HFA 108 (90 BASE) MCG/ACT IN AERS: 2.0000 | INHALATION_SPRAY | Freq: Four times a day (QID) | RESPIRATORY_TRACT | Status: DC | PRN

## 2012-08-21 MED ORDER — EPINEPHRINE 0.3 MG/0.3ML IJ DEVI: INTRAMUSCULAR | Status: DC

## 2012-08-21 MED ORDER — AZELASTINE-FLUTICASONE 137-50 MCG/ACT NA SUSP: 2.0000 | Freq: Every day | NASAL | Status: DC

## 2012-08-21 NOTE — Progress Notes (Signed)
08/22/11- 41 yoF never smoker followed for allergic rhinitis, asthma LOV-08/20/2010 Continues allergy vaccine here at 1:10 and believes it helps her. Uses Allegra and nasal saline rinse when needed. Recent nasal congestion is blamed on the weather. One episode of bronchitis in December. Has tried many different nasal steroid sprays.  08/21/12- 69 yoF never smoker followed for allergic rhinitis, asthma FOLLOWS FOR: still on allergy vaccine 1:10 GH and doing well. Denies any flare ups Patanase nasal spray did help last year. Diagnosed with hypertension so we discussed interaction of antihistamines and decongestants  ROS-see HPI Constitutional:   No-   weight loss, night sweats, fevers, chills, fatigue, lassitude. HEENT:   No-  headaches, difficulty swallowing, tooth/dental problems, sore throat,       No-  sneezing, itching, ear ache, +nasal congestion, post nasal drip,  CV:  No-   chest pain, orthopnea, PND, swelling in lower extremities, anasarca, dizziness, palpitations Resp: No-   shortness of breath with exertion or at rest.              No-   productive cough,  No non-productive cough,  No- coughing up of blood.              No-   change in color of mucus.  No- wheezing.   Skin: No-   rash or lesions. GI:  No-   heartburn, indigestion, abdominal pain, nausea, vomiting,  GU:  MS:  No-   joint pain or swelling. . Neuro-     nothing unusual Psych:  No- change in mood or affect. No depression or anxiety.  No memory loss.  OBJ- Physical Exam General- Alert, Oriented, Affect-appropriate, Distress- none acute Skin- rash-none, lesions- none, excoriation- none Lymphadenopathy- none Head- atraumatic            Eyes- Gross vision intact, PERRLA, conjunctivae and secretions clear            Ears- Hearing, canals-normal            Nose- +sniffing,  no-Septal dev, mucus, polyps, erosion, perforation             Throat- Mallampati II , mucosa clear , drainage- none, tonsils- atrophic Neck-  flexible , trachea midline, no stridor , thyroid nl, carotid no bruit Chest - symmetrical excursion , unlabored           Heart/CV- RRR , no murmur , no gallop  , no rub, nl s1 s2                           - JVD- none , edema- none, stasis changes- none, varices- none           Lung- clear to P&A, wheeze- none, cough- none , dullness-none, rub- none           Chest wall-  Abd-  Br/ Gen/ Rectal- Not done, not indicated Extrem- cyanosis- none, clubbing, none, atrophy- none, strength- nl Neuro- grossly intact to observation

## 2012-08-21 NOTE — Patient Instructions (Addendum)
We can continue allergy vaccine 1:10 GH  Refill scripts for Epipen, Dymista and Proair  Please call as needed

## 2012-08-22 NOTE — Assessment & Plan Note (Signed)
Plan-refill Proair rescue inhaler with discussion

## 2012-08-22 NOTE — Assessment & Plan Note (Signed)
She is doing well with allergy vaccine and can continue. Plan-refill EpiPen. Try a sample of Dymista as a supplement as needed

## 2012-08-27 ENCOUNTER — Ambulatory Visit: Payer: BC Managed Care – PPO

## 2012-08-28 ENCOUNTER — Ambulatory Visit: Payer: BC Managed Care – PPO

## 2012-08-29 ENCOUNTER — Other Ambulatory Visit: Payer: Self-pay | Admitting: Internal Medicine

## 2012-09-04 ENCOUNTER — Ambulatory Visit (INDEPENDENT_AMBULATORY_CARE_PROVIDER_SITE_OTHER): Payer: BC Managed Care – PPO

## 2012-09-04 DIAGNOSIS — J309 Allergic rhinitis, unspecified: Secondary | ICD-10-CM

## 2012-09-10 ENCOUNTER — Ambulatory Visit (INDEPENDENT_AMBULATORY_CARE_PROVIDER_SITE_OTHER): Payer: BC Managed Care – PPO

## 2012-09-10 DIAGNOSIS — J309 Allergic rhinitis, unspecified: Secondary | ICD-10-CM

## 2012-09-18 ENCOUNTER — Ambulatory Visit (INDEPENDENT_AMBULATORY_CARE_PROVIDER_SITE_OTHER): Payer: BC Managed Care – PPO

## 2012-09-18 DIAGNOSIS — J309 Allergic rhinitis, unspecified: Secondary | ICD-10-CM

## 2012-09-24 ENCOUNTER — Ambulatory Visit (INDEPENDENT_AMBULATORY_CARE_PROVIDER_SITE_OTHER): Payer: BC Managed Care – PPO

## 2012-09-24 DIAGNOSIS — J309 Allergic rhinitis, unspecified: Secondary | ICD-10-CM

## 2012-09-25 ENCOUNTER — Ambulatory Visit: Payer: BC Managed Care – PPO

## 2012-10-01 ENCOUNTER — Ambulatory Visit (INDEPENDENT_AMBULATORY_CARE_PROVIDER_SITE_OTHER): Payer: BC Managed Care – PPO

## 2012-10-01 DIAGNOSIS — J309 Allergic rhinitis, unspecified: Secondary | ICD-10-CM

## 2012-10-02 ENCOUNTER — Ambulatory Visit: Payer: BC Managed Care – PPO

## 2012-10-03 ENCOUNTER — Ambulatory Visit (INDEPENDENT_AMBULATORY_CARE_PROVIDER_SITE_OTHER): Payer: BC Managed Care – PPO

## 2012-10-03 DIAGNOSIS — J309 Allergic rhinitis, unspecified: Secondary | ICD-10-CM

## 2012-10-17 ENCOUNTER — Ambulatory Visit (INDEPENDENT_AMBULATORY_CARE_PROVIDER_SITE_OTHER): Payer: BC Managed Care – PPO

## 2012-10-17 DIAGNOSIS — J309 Allergic rhinitis, unspecified: Secondary | ICD-10-CM

## 2012-10-23 ENCOUNTER — Ambulatory Visit: Payer: BC Managed Care – PPO

## 2012-10-25 ENCOUNTER — Ambulatory Visit (INDEPENDENT_AMBULATORY_CARE_PROVIDER_SITE_OTHER): Payer: BC Managed Care – PPO

## 2012-10-25 DIAGNOSIS — J309 Allergic rhinitis, unspecified: Secondary | ICD-10-CM

## 2012-10-30 ENCOUNTER — Ambulatory Visit: Payer: BC Managed Care – PPO

## 2012-11-05 ENCOUNTER — Ambulatory Visit (INDEPENDENT_AMBULATORY_CARE_PROVIDER_SITE_OTHER): Payer: BC Managed Care – PPO

## 2012-11-05 DIAGNOSIS — J309 Allergic rhinitis, unspecified: Secondary | ICD-10-CM

## 2012-11-26 ENCOUNTER — Ambulatory Visit (INDEPENDENT_AMBULATORY_CARE_PROVIDER_SITE_OTHER): Payer: BC Managed Care – PPO

## 2012-11-26 DIAGNOSIS — J309 Allergic rhinitis, unspecified: Secondary | ICD-10-CM

## 2012-12-03 ENCOUNTER — Ambulatory Visit: Payer: BC Managed Care – PPO

## 2012-12-10 ENCOUNTER — Ambulatory Visit (INDEPENDENT_AMBULATORY_CARE_PROVIDER_SITE_OTHER): Payer: BC Managed Care – PPO

## 2012-12-10 DIAGNOSIS — J309 Allergic rhinitis, unspecified: Secondary | ICD-10-CM

## 2012-12-18 ENCOUNTER — Ambulatory Visit: Payer: BC Managed Care – PPO

## 2012-12-25 ENCOUNTER — Ambulatory Visit (INDEPENDENT_AMBULATORY_CARE_PROVIDER_SITE_OTHER): Payer: BC Managed Care – PPO

## 2012-12-25 DIAGNOSIS — J309 Allergic rhinitis, unspecified: Secondary | ICD-10-CM

## 2013-01-08 ENCOUNTER — Ambulatory Visit (INDEPENDENT_AMBULATORY_CARE_PROVIDER_SITE_OTHER): Payer: BC Managed Care – PPO

## 2013-01-08 DIAGNOSIS — J309 Allergic rhinitis, unspecified: Secondary | ICD-10-CM

## 2013-01-14 ENCOUNTER — Ambulatory Visit (INDEPENDENT_AMBULATORY_CARE_PROVIDER_SITE_OTHER): Payer: BC Managed Care – PPO

## 2013-01-14 DIAGNOSIS — J309 Allergic rhinitis, unspecified: Secondary | ICD-10-CM

## 2013-01-21 ENCOUNTER — Ambulatory Visit (INDEPENDENT_AMBULATORY_CARE_PROVIDER_SITE_OTHER): Payer: BC Managed Care – PPO

## 2013-01-21 DIAGNOSIS — J309 Allergic rhinitis, unspecified: Secondary | ICD-10-CM

## 2013-01-28 ENCOUNTER — Ambulatory Visit: Payer: BC Managed Care – PPO

## 2013-02-04 ENCOUNTER — Ambulatory Visit (INDEPENDENT_AMBULATORY_CARE_PROVIDER_SITE_OTHER): Payer: BC Managed Care – PPO

## 2013-02-04 DIAGNOSIS — J309 Allergic rhinitis, unspecified: Secondary | ICD-10-CM

## 2013-02-11 ENCOUNTER — Ambulatory Visit: Payer: BC Managed Care – PPO

## 2013-02-15 ENCOUNTER — Ambulatory Visit (INDEPENDENT_AMBULATORY_CARE_PROVIDER_SITE_OTHER): Payer: BC Managed Care – PPO

## 2013-02-15 DIAGNOSIS — J309 Allergic rhinitis, unspecified: Secondary | ICD-10-CM

## 2013-02-18 ENCOUNTER — Ambulatory Visit (INDEPENDENT_AMBULATORY_CARE_PROVIDER_SITE_OTHER): Payer: BC Managed Care – PPO

## 2013-02-18 DIAGNOSIS — J309 Allergic rhinitis, unspecified: Secondary | ICD-10-CM

## 2013-02-26 ENCOUNTER — Ambulatory Visit: Payer: BC Managed Care – PPO

## 2013-03-05 ENCOUNTER — Ambulatory Visit (INDEPENDENT_AMBULATORY_CARE_PROVIDER_SITE_OTHER): Payer: BC Managed Care – PPO

## 2013-03-05 DIAGNOSIS — J309 Allergic rhinitis, unspecified: Secondary | ICD-10-CM

## 2013-03-11 ENCOUNTER — Ambulatory Visit: Payer: BC Managed Care – PPO

## 2013-03-25 ENCOUNTER — Ambulatory Visit (INDEPENDENT_AMBULATORY_CARE_PROVIDER_SITE_OTHER): Payer: BC Managed Care – PPO

## 2013-03-25 DIAGNOSIS — J309 Allergic rhinitis, unspecified: Secondary | ICD-10-CM

## 2013-04-01 ENCOUNTER — Ambulatory Visit (INDEPENDENT_AMBULATORY_CARE_PROVIDER_SITE_OTHER): Payer: BC Managed Care – PPO

## 2013-04-01 DIAGNOSIS — J309 Allergic rhinitis, unspecified: Secondary | ICD-10-CM

## 2013-04-08 ENCOUNTER — Ambulatory Visit: Payer: BC Managed Care – PPO

## 2013-04-10 ENCOUNTER — Other Ambulatory Visit: Payer: Self-pay | Admitting: Internal Medicine

## 2013-04-26 ENCOUNTER — Ambulatory Visit (INDEPENDENT_AMBULATORY_CARE_PROVIDER_SITE_OTHER): Payer: BC Managed Care – PPO

## 2013-04-26 DIAGNOSIS — J309 Allergic rhinitis, unspecified: Secondary | ICD-10-CM

## 2013-05-06 ENCOUNTER — Ambulatory Visit (INDEPENDENT_AMBULATORY_CARE_PROVIDER_SITE_OTHER): Payer: BC Managed Care – PPO

## 2013-05-06 DIAGNOSIS — J309 Allergic rhinitis, unspecified: Secondary | ICD-10-CM

## 2013-05-07 ENCOUNTER — Ambulatory Visit (INDEPENDENT_AMBULATORY_CARE_PROVIDER_SITE_OTHER): Payer: BC Managed Care – PPO

## 2013-05-07 DIAGNOSIS — J309 Allergic rhinitis, unspecified: Secondary | ICD-10-CM

## 2013-05-13 ENCOUNTER — Ambulatory Visit: Payer: BC Managed Care – PPO

## 2013-05-21 ENCOUNTER — Ambulatory Visit (INDEPENDENT_AMBULATORY_CARE_PROVIDER_SITE_OTHER): Payer: BC Managed Care – PPO

## 2013-05-21 DIAGNOSIS — J309 Allergic rhinitis, unspecified: Secondary | ICD-10-CM

## 2013-06-04 ENCOUNTER — Ambulatory Visit (INDEPENDENT_AMBULATORY_CARE_PROVIDER_SITE_OTHER): Payer: BC Managed Care – PPO

## 2013-06-04 DIAGNOSIS — J309 Allergic rhinitis, unspecified: Secondary | ICD-10-CM

## 2013-06-10 ENCOUNTER — Ambulatory Visit: Payer: BC Managed Care – PPO

## 2013-06-14 ENCOUNTER — Ambulatory Visit (INDEPENDENT_AMBULATORY_CARE_PROVIDER_SITE_OTHER): Payer: BC Managed Care – PPO

## 2013-06-14 DIAGNOSIS — J309 Allergic rhinitis, unspecified: Secondary | ICD-10-CM

## 2013-06-18 ENCOUNTER — Ambulatory Visit (INDEPENDENT_AMBULATORY_CARE_PROVIDER_SITE_OTHER): Payer: BC Managed Care – PPO

## 2013-06-18 DIAGNOSIS — J309 Allergic rhinitis, unspecified: Secondary | ICD-10-CM

## 2013-06-28 ENCOUNTER — Encounter: Payer: Self-pay | Admitting: Internal Medicine

## 2013-07-04 ENCOUNTER — Ambulatory Visit (INDEPENDENT_AMBULATORY_CARE_PROVIDER_SITE_OTHER): Payer: BC Managed Care – PPO

## 2013-07-04 DIAGNOSIS — J309 Allergic rhinitis, unspecified: Secondary | ICD-10-CM

## 2013-07-11 ENCOUNTER — Ambulatory Visit (INDEPENDENT_AMBULATORY_CARE_PROVIDER_SITE_OTHER): Payer: BC Managed Care – PPO

## 2013-07-11 DIAGNOSIS — J309 Allergic rhinitis, unspecified: Secondary | ICD-10-CM

## 2013-07-18 ENCOUNTER — Ambulatory Visit: Payer: BC Managed Care – PPO

## 2013-07-25 ENCOUNTER — Ambulatory Visit (INDEPENDENT_AMBULATORY_CARE_PROVIDER_SITE_OTHER): Payer: BC Managed Care – PPO

## 2013-07-25 DIAGNOSIS — J309 Allergic rhinitis, unspecified: Secondary | ICD-10-CM

## 2013-08-08 ENCOUNTER — Ambulatory Visit (INDEPENDENT_AMBULATORY_CARE_PROVIDER_SITE_OTHER): Payer: BC Managed Care – PPO

## 2013-08-08 DIAGNOSIS — J309 Allergic rhinitis, unspecified: Secondary | ICD-10-CM

## 2013-08-12 ENCOUNTER — Ambulatory Visit: Payer: BC Managed Care – PPO

## 2013-08-22 ENCOUNTER — Ambulatory Visit: Payer: BC Managed Care – PPO | Admitting: Internal Medicine

## 2013-08-26 ENCOUNTER — Ambulatory Visit: Payer: BC Managed Care – PPO | Admitting: Internal Medicine

## 2013-08-29 ENCOUNTER — Ambulatory Visit (INDEPENDENT_AMBULATORY_CARE_PROVIDER_SITE_OTHER): Payer: BC Managed Care – PPO

## 2013-08-29 DIAGNOSIS — J309 Allergic rhinitis, unspecified: Secondary | ICD-10-CM

## 2013-09-12 ENCOUNTER — Ambulatory Visit (INDEPENDENT_AMBULATORY_CARE_PROVIDER_SITE_OTHER): Payer: BC Managed Care – PPO

## 2013-09-12 ENCOUNTER — Encounter (INDEPENDENT_AMBULATORY_CARE_PROVIDER_SITE_OTHER): Payer: Self-pay

## 2013-09-12 ENCOUNTER — Ambulatory Visit (INDEPENDENT_AMBULATORY_CARE_PROVIDER_SITE_OTHER): Payer: BC Managed Care – PPO | Admitting: Internal Medicine

## 2013-09-12 ENCOUNTER — Encounter: Payer: Self-pay | Admitting: Internal Medicine

## 2013-09-12 VITALS — BP 122/78 | HR 101 | Ht 64.0 in | Wt 181.6 lb

## 2013-09-12 DIAGNOSIS — J309 Allergic rhinitis, unspecified: Secondary | ICD-10-CM

## 2013-09-12 DIAGNOSIS — J45909 Unspecified asthma, uncomplicated: Secondary | ICD-10-CM

## 2013-09-12 DIAGNOSIS — J3089 Other allergic rhinitis: Principal | ICD-10-CM

## 2013-09-12 DIAGNOSIS — J302 Other seasonal allergic rhinitis: Secondary | ICD-10-CM

## 2013-09-12 DIAGNOSIS — J4520 Mild intermittent asthma, uncomplicated: Secondary | ICD-10-CM

## 2013-09-12 MED ORDER — ALBUTEROL SULFATE HFA 108 (90 BASE) MCG/ACT IN AERS: 2.0000 | INHALATION_SPRAY | Freq: Four times a day (QID) | RESPIRATORY_TRACT | Status: DC | PRN

## 2013-09-12 MED ORDER — EPINEPHRINE 0.3 MG/0.3ML IJ SOAJ: INTRAMUSCULAR | Status: AC

## 2013-09-12 NOTE — Progress Notes (Signed)
08/22/11- 41 yoF never smoker followed for allergic rhinitis, asthma LOV-08/20/2010 Continues allergy vaccine here at 1:10 and believes it helps her. Uses Allegra and nasal saline rinse when needed. Recent nasal congestion is blamed on the weather. One episode of bronchitis in December. Has tried many different nasal steroid sprays.  08/21/12- 1 yoF never smoker followed for allergic rhinitis, asthma FOLLOWS FOR: still on allergy vaccine 1:10 GH and doing well. Denies any flare ups Patanase nasal spray did help last year. Diagnosed with hypertension so we discussed interaction of antihistamines and decongestants  09/12/13- 46 yoF never smoker followed for allergic rhinitis, asthma FOLLOWS FOR: still on Allergy vaccine 1:10 GH and doing well; has had slight flare up but using Sudafed at this time. Continues Flonase. Mild nasal symptoms.  ROS-see HPI Constitutional:   No-   weight loss, night sweats, fevers, chills, fatigue, lassitude. HEENT:   No-  headaches, difficulty swallowing, tooth/dental problems, sore throat,       No-  sneezing, itching, ear ache, +nasal congestion, post nasal drip,  CV:  No-   chest pain, orthopnea, PND, swelling in lower extremities, anasarca, dizziness, palpitations Resp: No-   shortness of breath with exertion or at rest.              No-   productive cough,  No non-productive cough,  No- coughing up of blood.              No-   change in color of mucus.  No- wheezing.   Skin: No-   rash or lesions. GI:  No-   heartburn, indigestion, abdominal pain, nausea, vomiting,  GU:  MS:  No-   joint pain or swelling. . Neuro-     nothing unusual Psych:  No- change in mood or affect. No depression or anxiety.  No memory loss.  OBJ- Physical Exam General- Alert, Oriented, Affect-appropriate, Distress- none acute Skin- rash-none, lesions- none, excoriation- none Lymphadenopathy- none Head- atraumatic            Eyes- Gross vision intact, PERRLA, conjunctivae and  secretions clear            Ears- +bilateral hearing aids            Nose- +sniffing,  + turbinate edema R>L, no-Septal dev, mucus, polyps, erosion,                          perforation             Throat- Mallampati II , mucosa clear , drainage- none, tonsils- atrophic Neck- flexible , trachea midline, no stridor , thyroid nl, carotid no bruit Chest - symmetrical excursion , unlabored           Heart/CV- RRR , no murmur , no gallop  , no rub, nl s1 s2                           - JVD- none , edema- none, stasis changes- none, varices- none           Lung- clear to P&A, wheeze- none, cough- none , dullness-none, rub- none           Chest wall-  Abd-  Br/ Gen/ Rectal- Not done, not indicated Extrem- cyanosis- none, clubbing, none, atrophy- none, strength- nl Neuro- grossly intact to observation

## 2013-09-12 NOTE — Patient Instructions (Signed)
We can continue allergy vaccine 1:10 GH  Scripts sent refilling Epipen and Proair rescue inhaler  Please call as needed

## 2013-09-26 ENCOUNTER — Ambulatory Visit (INDEPENDENT_AMBULATORY_CARE_PROVIDER_SITE_OTHER): Payer: BC Managed Care – PPO

## 2013-09-26 DIAGNOSIS — J309 Allergic rhinitis, unspecified: Secondary | ICD-10-CM

## 2013-09-29 NOTE — Assessment & Plan Note (Signed)
Mild seasonal exacerbation seems controlled. Okay to continue allergy vaccine. We discussed risk benefit again and will refill her EpiPen

## 2013-09-29 NOTE — Assessment & Plan Note (Signed)
Plan-refill pro air

## 2013-10-14 ENCOUNTER — Ambulatory Visit (INDEPENDENT_AMBULATORY_CARE_PROVIDER_SITE_OTHER): Payer: BC Managed Care – PPO

## 2013-10-14 DIAGNOSIS — J309 Allergic rhinitis, unspecified: Secondary | ICD-10-CM

## 2013-10-21 ENCOUNTER — Ambulatory Visit (INDEPENDENT_AMBULATORY_CARE_PROVIDER_SITE_OTHER): Payer: BC Managed Care – PPO

## 2013-10-21 DIAGNOSIS — J309 Allergic rhinitis, unspecified: Secondary | ICD-10-CM

## 2013-10-28 ENCOUNTER — Ambulatory Visit: Payer: BC Managed Care – PPO

## 2013-11-02 ENCOUNTER — Other Ambulatory Visit: Payer: Self-pay | Admitting: Internal Medicine

## 2013-11-11 ENCOUNTER — Ambulatory Visit (INDEPENDENT_AMBULATORY_CARE_PROVIDER_SITE_OTHER): Payer: BC Managed Care – PPO

## 2013-11-11 DIAGNOSIS — J309 Allergic rhinitis, unspecified: Secondary | ICD-10-CM

## 2013-11-19 ENCOUNTER — Ambulatory Visit: Payer: BC Managed Care – PPO

## 2013-12-02 ENCOUNTER — Ambulatory Visit (INDEPENDENT_AMBULATORY_CARE_PROVIDER_SITE_OTHER): Payer: BC Managed Care – PPO

## 2013-12-02 DIAGNOSIS — J309 Allergic rhinitis, unspecified: Secondary | ICD-10-CM

## 2013-12-09 ENCOUNTER — Ambulatory Visit (INDEPENDENT_AMBULATORY_CARE_PROVIDER_SITE_OTHER): Payer: BC Managed Care – PPO

## 2013-12-09 DIAGNOSIS — J309 Allergic rhinitis, unspecified: Secondary | ICD-10-CM

## 2013-12-16 ENCOUNTER — Ambulatory Visit (INDEPENDENT_AMBULATORY_CARE_PROVIDER_SITE_OTHER): Payer: BC Managed Care – PPO

## 2013-12-16 DIAGNOSIS — J309 Allergic rhinitis, unspecified: Secondary | ICD-10-CM

## 2013-12-19 ENCOUNTER — Ambulatory Visit (INDEPENDENT_AMBULATORY_CARE_PROVIDER_SITE_OTHER): Payer: BC Managed Care – PPO

## 2013-12-19 DIAGNOSIS — J309 Allergic rhinitis, unspecified: Secondary | ICD-10-CM

## 2013-12-23 ENCOUNTER — Ambulatory Visit (INDEPENDENT_AMBULATORY_CARE_PROVIDER_SITE_OTHER): Payer: BC Managed Care – PPO

## 2013-12-23 DIAGNOSIS — J309 Allergic rhinitis, unspecified: Secondary | ICD-10-CM

## 2014-01-06 ENCOUNTER — Ambulatory Visit: Payer: BC Managed Care – PPO

## 2014-01-17 ENCOUNTER — Ambulatory Visit (INDEPENDENT_AMBULATORY_CARE_PROVIDER_SITE_OTHER): Payer: BC Managed Care – PPO

## 2014-01-17 DIAGNOSIS — J309 Allergic rhinitis, unspecified: Secondary | ICD-10-CM

## 2014-01-20 ENCOUNTER — Ambulatory Visit (INDEPENDENT_AMBULATORY_CARE_PROVIDER_SITE_OTHER): Payer: BC Managed Care – PPO

## 2014-01-20 DIAGNOSIS — J309 Allergic rhinitis, unspecified: Secondary | ICD-10-CM

## 2014-01-27 ENCOUNTER — Ambulatory Visit (INDEPENDENT_AMBULATORY_CARE_PROVIDER_SITE_OTHER): Payer: BC Managed Care – PPO

## 2014-01-27 DIAGNOSIS — J309 Allergic rhinitis, unspecified: Secondary | ICD-10-CM

## 2014-02-03 ENCOUNTER — Other Ambulatory Visit: Payer: Self-pay

## 2014-02-03 ENCOUNTER — Ambulatory Visit: Payer: BC Managed Care – PPO

## 2014-02-03 MED ORDER — MONTELUKAST SODIUM 10 MG PO TABS: 10.0000 mg | ORAL_TABLET | Freq: Every day | ORAL | Status: DC

## 2014-02-05 ENCOUNTER — Encounter: Payer: Self-pay | Admitting: Internal Medicine

## 2014-02-11 ENCOUNTER — Ambulatory Visit (INDEPENDENT_AMBULATORY_CARE_PROVIDER_SITE_OTHER): Payer: BC Managed Care – PPO

## 2014-02-11 DIAGNOSIS — J309 Allergic rhinitis, unspecified: Secondary | ICD-10-CM

## 2014-02-18 ENCOUNTER — Ambulatory Visit: Payer: BC Managed Care – PPO

## 2014-02-19 ENCOUNTER — Ambulatory Visit (INDEPENDENT_AMBULATORY_CARE_PROVIDER_SITE_OTHER): Payer: BC Managed Care – PPO

## 2014-02-19 DIAGNOSIS — J309 Allergic rhinitis, unspecified: Secondary | ICD-10-CM

## 2014-02-26 ENCOUNTER — Ambulatory Visit (INDEPENDENT_AMBULATORY_CARE_PROVIDER_SITE_OTHER): Payer: BC Managed Care – PPO

## 2014-02-26 DIAGNOSIS — J309 Allergic rhinitis, unspecified: Secondary | ICD-10-CM

## 2014-03-05 ENCOUNTER — Ambulatory Visit: Payer: BC Managed Care – PPO

## 2014-03-12 ENCOUNTER — Ambulatory Visit (INDEPENDENT_AMBULATORY_CARE_PROVIDER_SITE_OTHER): Payer: BC Managed Care – PPO

## 2014-03-12 DIAGNOSIS — J309 Allergic rhinitis, unspecified: Secondary | ICD-10-CM

## 2014-03-19 ENCOUNTER — Ambulatory Visit: Payer: BC Managed Care – PPO

## 2014-04-02 ENCOUNTER — Ambulatory Visit (INDEPENDENT_AMBULATORY_CARE_PROVIDER_SITE_OTHER): Payer: BC Managed Care – PPO

## 2014-04-02 DIAGNOSIS — J309 Allergic rhinitis, unspecified: Secondary | ICD-10-CM

## 2014-04-09 ENCOUNTER — Ambulatory Visit (INDEPENDENT_AMBULATORY_CARE_PROVIDER_SITE_OTHER): Payer: BC Managed Care – PPO

## 2014-04-09 DIAGNOSIS — J309 Allergic rhinitis, unspecified: Secondary | ICD-10-CM

## 2014-04-16 ENCOUNTER — Ambulatory Visit (INDEPENDENT_AMBULATORY_CARE_PROVIDER_SITE_OTHER): Payer: BC Managed Care – PPO

## 2014-04-16 DIAGNOSIS — J309 Allergic rhinitis, unspecified: Secondary | ICD-10-CM

## 2014-04-23 ENCOUNTER — Ambulatory Visit: Payer: BC Managed Care – PPO

## 2014-05-09 ENCOUNTER — Encounter: Payer: Self-pay | Admitting: Internal Medicine

## 2014-05-16 ENCOUNTER — Ambulatory Visit (INDEPENDENT_AMBULATORY_CARE_PROVIDER_SITE_OTHER): Payer: BC Managed Care – PPO

## 2014-05-16 DIAGNOSIS — J309 Allergic rhinitis, unspecified: Secondary | ICD-10-CM

## 2014-05-20 ENCOUNTER — Ambulatory Visit (INDEPENDENT_AMBULATORY_CARE_PROVIDER_SITE_OTHER): Payer: BC Managed Care – PPO

## 2014-05-20 DIAGNOSIS — J309 Allergic rhinitis, unspecified: Secondary | ICD-10-CM

## 2014-05-28 ENCOUNTER — Ambulatory Visit (INDEPENDENT_AMBULATORY_CARE_PROVIDER_SITE_OTHER): Payer: BC Managed Care – PPO

## 2014-05-28 DIAGNOSIS — J309 Allergic rhinitis, unspecified: Secondary | ICD-10-CM

## 2014-06-04 ENCOUNTER — Ambulatory Visit: Payer: BC Managed Care – PPO

## 2014-07-02 ENCOUNTER — Encounter: Payer: Self-pay | Admitting: Internal Medicine

## 2014-07-17 ENCOUNTER — Ambulatory Visit (INDEPENDENT_AMBULATORY_CARE_PROVIDER_SITE_OTHER): Payer: Self-pay

## 2014-07-17 DIAGNOSIS — J309 Allergic rhinitis, unspecified: Secondary | ICD-10-CM

## 2014-07-23 ENCOUNTER — Ambulatory Visit (INDEPENDENT_AMBULATORY_CARE_PROVIDER_SITE_OTHER): Payer: Self-pay

## 2014-07-23 DIAGNOSIS — J309 Allergic rhinitis, unspecified: Secondary | ICD-10-CM

## 2014-07-30 ENCOUNTER — Ambulatory Visit: Payer: Self-pay

## 2014-08-22 ENCOUNTER — Ambulatory Visit (INDEPENDENT_AMBULATORY_CARE_PROVIDER_SITE_OTHER): Payer: Self-pay

## 2014-08-22 DIAGNOSIS — J309 Allergic rhinitis, unspecified: Secondary | ICD-10-CM

## 2014-08-27 ENCOUNTER — Ambulatory Visit (INDEPENDENT_AMBULATORY_CARE_PROVIDER_SITE_OTHER): Payer: Self-pay

## 2014-08-27 DIAGNOSIS — J309 Allergic rhinitis, unspecified: Secondary | ICD-10-CM

## 2014-09-03 ENCOUNTER — Ambulatory Visit: Payer: Self-pay

## 2014-09-12 ENCOUNTER — Ambulatory Visit (INDEPENDENT_AMBULATORY_CARE_PROVIDER_SITE_OTHER): Payer: Self-pay

## 2014-09-12 DIAGNOSIS — J309 Allergic rhinitis, unspecified: Secondary | ICD-10-CM

## 2014-09-18 ENCOUNTER — Encounter: Payer: Self-pay | Admitting: Internal Medicine

## 2014-09-18 ENCOUNTER — Ambulatory Visit (INDEPENDENT_AMBULATORY_CARE_PROVIDER_SITE_OTHER): Payer: BLUE CROSS/BLUE SHIELD | Admitting: Internal Medicine

## 2014-09-18 ENCOUNTER — Ambulatory Visit (INDEPENDENT_AMBULATORY_CARE_PROVIDER_SITE_OTHER): Payer: BLUE CROSS/BLUE SHIELD

## 2014-09-18 VITALS — BP 124/84 | HR 106 | Ht 64.0 in | Wt 182.0 lb

## 2014-09-18 DIAGNOSIS — J309 Allergic rhinitis, unspecified: Secondary | ICD-10-CM | POA: Diagnosis not present

## 2014-09-18 DIAGNOSIS — J3089 Other allergic rhinitis: Secondary | ICD-10-CM

## 2014-09-18 DIAGNOSIS — J302 Other seasonal allergic rhinitis: Secondary | ICD-10-CM

## 2014-09-18 DIAGNOSIS — J4521 Mild intermittent asthma with (acute) exacerbation: Secondary | ICD-10-CM

## 2014-09-18 MED ORDER — METHYLPREDNISOLONE ACETATE 80 MG/ML IJ SUSP
80.0000 mg | Freq: Once | INTRAMUSCULAR | Status: AC
  Administered 2014-09-18: 80 mg via INTRAMUSCULAR

## 2014-09-18 MED ORDER — PHENYLEPHRINE HCL 1 % NA SOLN
3.0000 [drp] | Freq: Once | NASAL | Status: AC
  Administered 2014-09-18: 3 [drp] via NASAL

## 2014-09-18 NOTE — Patient Instructions (Signed)
Neb neo nasal  Depo 80  Throat lozenges, sips of liquids and otc cough syrups like Delsym may help  We have stopped singulair as not helpful

## 2014-09-18 NOTE — Progress Notes (Signed)
08/22/11- 41 yoF never smoker followed for allergic rhinitis, asthma LOV-08/20/2010 Continues allergy vaccine here at 1:10 and believes it helps her. Uses Allegra and nasal saline rinse when needed. Recent nasal congestion is blamed on the weather. One episode of bronchitis in December. Has tried many different nasal steroid sprays.  08/21/12- 47 yoF never smoker followed for allergic rhinitis, asthma FOLLOWS FOR: still on allergy vaccine 1:10 GH and doing well. Denies any flare ups Patanase nasal spray did help last year. Diagnosed with hypertension so we discussed interaction of antihistamines and decongestants  09/12/13- 43 yoF never smoker followed for allergic rhinitis, asthma FOLLOWS FOR: still on Allergy vaccine 1:10 GH and doing well; has had slight flare up but using Sudafed at this time. Continues Flonase. Mild nasal symptoms.  09/18/14-44 yoF never smoker followed for allergic rhinitis, asthma FOLLOWS FOR c/o sinus congestion with sore throat x 1 week  Still on allergy vaccine 1:10 GH Quit Singulair-didn't help. Using rescue inhaler about twice a week. Asthma does not wake at night.  ROS-see HPI Constitutional:   No-   weight loss, night sweats, fevers, chills, fatigue, lassitude. HEENT:   No-  headaches, difficulty swallowing, tooth/dental problems, sore throat,       No-  sneezing, itching, ear ache, +nasal congestion, post nasal drip,  CV:  No-   chest pain, orthopnea, PND, swelling in lower extremities, anasarca, dizziness, palpitations Resp: No-   shortness of breath with exertion or at rest.              No-   productive cough,  No non-productive cough,  No- coughing up of blood.              No-   change in color of mucus.  + wheezing.   Skin: No-   rash or lesions. GI:  No-   heartburn, indigestion, abdominal pain, nausea, vomiting,  GU:  MS:  No-   joint pain or swelling. . Neuro-     nothing unusual Psych:  No- change in mood or affect. No depression or anxiety.  No  memory loss.  OBJ- Physical Exam General- Alert, Oriented, Affect-appropriate, Distress- none acute Skin- rash-none, lesions- none, excoriation- none Lymphadenopathy- none Head- atraumatic            Eyes- Gross vision intact, PERRLA, conjunctivae and secretions clear            Ears- +bilateral hearing aids            Nose- +sniffing,  + turbinate edema R>L, no-Septal dev, mucus+, polyps, erosion,  perforation             Throat- Mallampati II , mucosa clear , drainage- none, tonsils- atrophic Neck- flexible , trachea midline, no stridor , thyroid nl, carotid no bruit Chest - symmetrical excursion , unlabored           Heart/CV- RRR , no murmur , no gallop  , no rub, nl s1 s2                           - JVD- none , edema- none, stasis changes- none, varices- none           Lung- clear to P&A, wheeze- none, cough+dry , dullness-none, rub- none           Chest wall-  Abd-  Br/ Gen/ Rectal- Not done, not indicated Extrem- cyanosis- none, clubbing, none, atrophy- none, strength- nl Neuro- grossly  intact to observation

## 2014-09-21 NOTE — Assessment & Plan Note (Signed)
Continues allergy vaccine 1:10 GH Plan nasal nebulizer decongestant and Depo-Medrol today for acute exacerbation-viral or allergic

## 2014-09-21 NOTE — Assessment & Plan Note (Signed)
Mild exacerbation-allergic or viral Plan-Depo-Medrol

## 2014-09-22 ENCOUNTER — Ambulatory Visit (INDEPENDENT_AMBULATORY_CARE_PROVIDER_SITE_OTHER): Payer: BLUE CROSS/BLUE SHIELD

## 2014-09-22 DIAGNOSIS — J309 Allergic rhinitis, unspecified: Secondary | ICD-10-CM | POA: Diagnosis not present

## 2014-09-24 ENCOUNTER — Ambulatory Visit (INDEPENDENT_AMBULATORY_CARE_PROVIDER_SITE_OTHER): Payer: BLUE CROSS/BLUE SHIELD

## 2014-09-24 DIAGNOSIS — J309 Allergic rhinitis, unspecified: Secondary | ICD-10-CM | POA: Diagnosis not present

## 2014-09-25 ENCOUNTER — Ambulatory Visit: Payer: BLUE CROSS/BLUE SHIELD

## 2014-10-02 ENCOUNTER — Encounter: Payer: Self-pay | Admitting: Internal Medicine

## 2014-10-02 NOTE — Telephone Encounter (Signed)
Hey Mrs.Grandville Silos, Dr. Annamaria Boots said that would be fine. I just need to know which I just need the nurse's name and ph.# so I can contact her and go over some guidelines with her per Dr. Annamaria Boots.

## 2014-10-02 NOTE — Telephone Encounter (Signed)
What [facility] office has a medical clinic on site? [place]- It would be ok to contact a nurse or PA at the [facility] to set up the usual consistent guidelines, documentation and communciation. Make sure someone there is willing to take this responsibility, and that the patient has Epipen she carries with her each time.

## 2014-10-02 NOTE — Telephone Encounter (Signed)
CY - please advise. Thanks! 

## 2014-10-03 ENCOUNTER — Ambulatory Visit (INDEPENDENT_AMBULATORY_CARE_PROVIDER_SITE_OTHER): Payer: BLUE CROSS/BLUE SHIELD

## 2014-10-03 DIAGNOSIS — J309 Allergic rhinitis, unspecified: Secondary | ICD-10-CM | POA: Diagnosis not present

## 2014-10-09 NOTE — Telephone Encounter (Signed)
Dr. Young please advise.  Thanks! 

## 2014-10-15 ENCOUNTER — Ambulatory Visit (INDEPENDENT_AMBULATORY_CARE_PROVIDER_SITE_OTHER): Payer: BLUE CROSS/BLUE SHIELD

## 2014-10-15 DIAGNOSIS — J309 Allergic rhinitis, unspecified: Secondary | ICD-10-CM | POA: Diagnosis not present

## 2015-02-24 LAB — HM PAP SMEAR: HM Pap smear: NEGATIVE

## 2015-03-11 ENCOUNTER — Encounter: Payer: Self-pay | Admitting: Internal Medicine

## 2015-05-01 ENCOUNTER — Encounter: Payer: Self-pay | Admitting: Internal Medicine

## 2015-05-01 NOTE — Telephone Encounter (Signed)
Pt. Gets her shots here. Dr.Young, her last shot was 10/15/14 and her vac. Has exp. Because we made it 09/22/14. Where do I start her back at? 1:10 at 0.5. Please advise

## 2015-05-01 NOTE — Telephone Encounter (Signed)
Now 6 months off allergy shots. Suggest starting her back at 0.1 ml/ vial of 1:10 and rebuild as tolerated

## 2015-05-01 NOTE — Telephone Encounter (Signed)
Evelyn Chase, Patient is out of allergy serum and does not have a mail-in request sheet.   Thanks!

## 2015-05-01 NOTE — Telephone Encounter (Signed)
Hey Evelyn Chase, Since it's been awhile since you've been in, Dr. Annamaria Boots said start you back at 0.1 of 1:10. Please call [phone] to make an appt. For your shot. Thanks! Have a great weekend.

## 2015-05-05 ENCOUNTER — Ambulatory Visit (INDEPENDENT_AMBULATORY_CARE_PROVIDER_SITE_OTHER): Payer: BLUE CROSS/BLUE SHIELD

## 2015-05-05 ENCOUNTER — Telehealth: Payer: Self-pay | Admitting: Internal Medicine

## 2015-05-05 DIAGNOSIS — J309 Allergic rhinitis, unspecified: Secondary | ICD-10-CM

## 2015-05-05 NOTE — Telephone Encounter (Signed)
Allergy Serum Extract Date Mixed: 05/05/2015 Vial: AB Strength: 1:10 Here/Mail/Pick Up: Here Mixed By: Desmond Dike, CMA

## 2015-08-20 ENCOUNTER — Encounter: Payer: Self-pay | Admitting: Internal Medicine

## 2015-09-18 ENCOUNTER — Ambulatory Visit: Payer: BLUE CROSS/BLUE SHIELD | Admitting: Internal Medicine

## 2015-09-18 ENCOUNTER — Ambulatory Visit (INDEPENDENT_AMBULATORY_CARE_PROVIDER_SITE_OTHER): Payer: BLUE CROSS/BLUE SHIELD | Admitting: Internal Medicine

## 2015-09-18 ENCOUNTER — Encounter: Payer: Self-pay | Admitting: Internal Medicine

## 2015-09-18 VITALS — BP 118/70 | HR 76 | Ht 64.0 in | Wt 174.2 lb

## 2015-09-18 DIAGNOSIS — J3089 Other allergic rhinitis: Principal | ICD-10-CM

## 2015-09-18 DIAGNOSIS — J4520 Mild intermittent asthma, uncomplicated: Secondary | ICD-10-CM | POA: Diagnosis not present

## 2015-09-18 DIAGNOSIS — J309 Allergic rhinitis, unspecified: Secondary | ICD-10-CM

## 2015-09-18 DIAGNOSIS — J302 Other seasonal allergic rhinitis: Secondary | ICD-10-CM

## 2015-09-18 NOTE — Assessment & Plan Note (Signed)
Continues mild intermittent asthma, well controlled, uncomplicated. Rare use of rescue inhaler. No sleep disturbance.

## 2015-09-18 NOTE — Progress Notes (Signed)
08/22/11- 41 yoF never smoker followed for allergic rhinitis, asthma LOV-08/20/2010 Continues allergy vaccine here at 1:10 and believes it helps her. Uses Allegra and nasal saline rinse when needed. Recent nasal congestion is blamed on the weather. One episode of bronchitis in December. Has tried many different nasal steroid sprays.  08/21/12- 35 yoF never smoker followed for allergic rhinitis, asthma FOLLOWS FOR: still on allergy vaccine 1:10 GH and doing well. Denies any flare ups Patanase nasal spray did help last year. Diagnosed with hypertension so we discussed interaction of antihistamines and decongestants  09/12/13- 52 yoF never smoker followed for allergic rhinitis, asthma FOLLOWS FOR: still on Allergy vaccine 1:10 GH and doing well; has had slight flare up but using Sudafed at this time. Continues Flonase. Mild nasal symptoms.  09/18/14-44 yoF never smoker followed for allergic rhinitis, asthma FOLLOWS FOR c/o sinus congestion with sore throat x 1 week  Still on allergy vaccine 1:10 GH Quit Singulair-didn't help. Using rescue inhaler about twice a week. Asthma does not wake at night.  09/18/2015-45 year old female never smoker followed for allergic rhinitis, asthma Allergy vaccine 1:10 GH FOLLOWS FOR: Still on vaccine and doing well with injections. Pt not currently having any issues with her allergies; had rough patch during winter time. Had what sounds like a viral bronchitis in December and they've been bothered during some warmer intervals in January. Using rescue inhaler about once every 6 weeks with no sleep disturbance. Continues daily Allegra most of the time and has Dymista if needed.  ROS-see HPI Constitutional:   No-   weight loss, night sweats, fevers, chills, fatigue, lassitude. HEENT:   No-  headaches, difficulty swallowing, tooth/dental problems, sore throat,       No-  sneezing, itching, ear ache, +nasal congestion, post nasal drip,  CV:  No-   chest pain, orthopnea,  PND, swelling in lower extremities, anasarca, dizziness, palpitations Resp: No-   shortness of breath with exertion or at rest.              No-   productive cough,  No non-productive cough,  No- coughing up of blood.              No-   change in color of mucus.  + wheezing.   Skin: No-   rash or lesions. GI:  No-   heartburn, indigestion, abdominal pain, nausea, vomiting,  GU:  MS:  No-   joint pain or swelling. . Neuro-     nothing unusual Psych:  No- change in mood or affect. No depression or anxiety.  No memory loss.  OBJ- Physical Exam General- Alert, Oriented, Affect-appropriate, Distress- none acute Skin- rash-none, lesions- none, excoriation- none Lymphadenopathy- none Head- atraumatic            Eyes- Gross vision intact, PERRLA, conjunctivae and secretions clear            Ears- +bilateral hearing aids            Nose- +sniffing,  + turbinate edema R>L, no-Septal dev, mucus+, polyps, erosion,  perforation             Throat- Mallampati II , mucosa clear , drainage- none, tonsils- atrophic Neck- flexible , trachea midline, no stridor , thyroid nl, carotid no bruit Chest - symmetrical excursion , unlabored           Heart/CV- RRR , no murmur , no gallop  , no rub, nl s1 s2                           -  JVD- none , edema- none, stasis changes- none, varices- none           Lung- clear to P&A, wheeze- none, cough+dry , dullness-none, rub- none           Chest wall-  Abd-  Br/ Gen/ Rectal- Not done, not indicated Extrem- cyanosis- none, clubbing, none, atrophy- none, strength- nl Neuro- grossly intact to observation

## 2015-09-18 NOTE — Patient Instructions (Signed)
We can continue allergy vaccine for another year, then decide what to do.  Current meds are ok.  Please call as needed

## 2015-09-18 NOTE — Assessment & Plan Note (Signed)
Discussed my eventual retirement and we agreed we would continue allergy vaccine through this year. Then she can reassess. Discussed maintenance use of antihistamine.

## 2015-10-02 LAB — HM COLONOSCOPY

## 2015-12-13 LAB — HM MAMMOGRAPHY

## 2016-04-29 ENCOUNTER — Other Ambulatory Visit (HOSPITAL_COMMUNITY): Payer: Self-pay | Admitting: Family Medicine

## 2016-04-29 DIAGNOSIS — R1011 Right upper quadrant pain: Secondary | ICD-10-CM

## 2016-05-09 ENCOUNTER — Ambulatory Visit (HOSPITAL_COMMUNITY)
Admission: RE | Admit: 2016-05-09 | Discharge: 2016-05-09 | Disposition: A | Discharge status: Home / Self Care | Payer: BLUE CROSS/BLUE SHIELD | Source: Ambulatory Visit | Attending: Family Medicine | Admitting: Family Medicine

## 2016-05-09 ENCOUNTER — Encounter (HOSPITAL_COMMUNITY): Payer: Self-pay | Admitting: Radiology

## 2016-05-09 DIAGNOSIS — R1011 Right upper quadrant pain: Secondary | ICD-10-CM | POA: Diagnosis not present

## 2016-05-09 MED ORDER — TECHNETIUM TC 99M MEBROFENIN IV KIT
5.0000 | PACK | Freq: Once | INTRAVENOUS | Status: AC | PRN
  Administered 2016-05-09: 5 via INTRAVENOUS

## 2016-05-25 ENCOUNTER — Ambulatory Visit: Payer: Self-pay | Admitting: General Surgery

## 2016-07-15 ENCOUNTER — Other Ambulatory Visit (HOSPITAL_COMMUNITY): Payer: Self-pay | Admitting: *Deleted

## 2016-07-15 ENCOUNTER — Encounter (HOSPITAL_COMMUNITY): Payer: Self-pay

## 2016-07-15 NOTE — Pre-Procedure Instructions (Signed)
    Carolrenee Tisdell  07/15/2016      [facility], [facility] - [place], [place] - [place] [address] Phone: [phone] Fax: [phone]  [facility] [place], [place] - [address] [place] [address] Phone: [phone] Fax: [phone]  [facility], [place], [place] - [address] [place] [address] Phone: [phone] Fax: [phone]    Your procedure is scheduled on 07-22-2016   Friday   Report to [facility] at 7:00 A.M.   Call this number if you have problems the morning of surgery:  [phone]   Remember:  Do not eat food or drink liquids after midnight.   Take these medicines the morning of surgery with A SIP OF WATER albuterol inhaler if needed,Azelastine nasal spray as needed,desogestrel-ethinyl estradiol,fexofenadine(Allegra),metoprolol(Toprol XL),       STOP ASPIRIN,ANTIINFLAMATORIES (IBUPROFEN,ALEVE,MOTRIN,ADVIL,GOODY'S POWDERS),HERBAL SUPPLEMENTS,FISH OIL,AND VITAMINS 5-7 DAYS PRIOR TO SURGERY   Do not wear jewelry, make-up or nail polish.  Do not wear lotions, powders, or perfumes, or deoderant.  Do not shave 48 hours prior to surgery.    Do not bring valuables to the hospital.  [facility] is not responsible for any belongings or valuables.  Contacts, dentures or bridgework may not be worn into surgery.  Leave your suitcase in the car.  After surgery it may be brought to your room.  For patients admitted to the hospital, discharge time will be determined by your treatment team.  Patients discharged the day of surgery will not be allowed to drive home.    Special instructions:  See attached sheet for instructions on [facility] showers    Please read over the following fact sheets that you were given. Coughing and Deep  Breathing

## 2016-07-18 ENCOUNTER — Encounter (HOSPITAL_COMMUNITY): Payer: Self-pay

## 2016-07-18 ENCOUNTER — Encounter (HOSPITAL_COMMUNITY)
Admission: RE | Admit: 2016-07-18 | Discharge: 2016-07-18 | Disposition: A | Discharge status: Home / Self Care | Payer: BLUE CROSS/BLUE SHIELD | Source: Ambulatory Visit | Attending: General Surgery | Admitting: General Surgery

## 2016-07-18 DIAGNOSIS — Z01812 Encounter for preprocedural laboratory examination: Secondary | ICD-10-CM | POA: Diagnosis present

## 2016-07-18 DIAGNOSIS — Z01810 Encounter for preprocedural cardiovascular examination: Secondary | ICD-10-CM | POA: Diagnosis present

## 2016-07-18 HISTORY — DX: Essential (primary) hypertension: I10

## 2016-07-18 LAB — BASIC METABOLIC PANEL
ANION GAP: 11 (ref 5–15)
BUN: 9 mg/dL (ref 6–20)
CALCIUM: 9.4 mg/dL (ref 8.9–10.3)
CHLORIDE: 104 mmol/L (ref 101–111)
CO2: 22 mmol/L (ref 22–32)
Creatinine, Ser: 0.84 mg/dL (ref 0.44–1.00)
GFR calc non Af Amer: >60 mL/min (ref >60)
Glucose, Bld: 97 mg/dL (ref 65–99)
POTASSIUM: 4.2 mmol/L (ref 3.5–5.1)
Sodium: 137 mmol/L (ref 135–145)

## 2016-07-18 LAB — CBC
HCT: 42.8 % (ref 36.0–46.0)
HEMOGLOBIN: 14.3 g/dL (ref 12.0–15.0)
MCH: 33.8 pg (ref 26.0–34.0)
MCHC: 33.4 g/dL (ref 30.0–36.0)
MCV: 101.2 fL — AB (ref 78.0–100.0)
Platelets: 162 10*3/uL (ref 150–400)
RBC: 4.23 MIL/uL (ref 3.87–5.11)
RDW: 14.4 % (ref 11.5–15.5)
WBC: 10.1 10*3/uL (ref 4.0–10.5)

## 2016-07-18 LAB — HCG, SERUM, QUALITATIVE: Preg, Serum: NEGATIVE

## 2016-07-18 NOTE — Progress Notes (Signed)
PCP is  Vicenta Aly, PA Denies ever seeing a cardiologist. Denies ever having a card cath, stress test, or echo.  EKG noted under care everywhere from 2013.

## 2016-07-22 ENCOUNTER — Ambulatory Visit (HOSPITAL_COMMUNITY): Admission: RE | Admit: 2016-07-22 | Payer: BLUE CROSS/BLUE SHIELD | Source: Ambulatory Visit

## 2016-07-22 ENCOUNTER — Ambulatory Visit (HOSPITAL_COMMUNITY): Payer: BLUE CROSS/BLUE SHIELD | Admitting: Emergency Medicine

## 2016-07-22 ENCOUNTER — Encounter (HOSPITAL_COMMUNITY): Payer: Self-pay | Admitting: *Deleted

## 2016-07-22 ENCOUNTER — Ambulatory Visit (HOSPITAL_COMMUNITY)
Admission: RE | Admit: 2016-07-22 | Discharge: 2016-07-22 | Disposition: A | Discharge status: Home / Self Care | Payer: BLUE CROSS/BLUE SHIELD | Source: Ambulatory Visit | Attending: General Surgery | Admitting: General Surgery

## 2016-07-22 ENCOUNTER — Ambulatory Visit (HOSPITAL_COMMUNITY): Payer: BLUE CROSS/BLUE SHIELD | Admitting: Anesthesiology

## 2016-07-22 ENCOUNTER — Encounter (HOSPITAL_COMMUNITY)
Admission: RE | Disposition: A | Discharge status: Home / Self Care | Payer: Self-pay | Source: Ambulatory Visit | Attending: General Surgery

## 2016-07-22 DIAGNOSIS — Z419 Encounter for procedure for purposes other than remedying health state, unspecified: Secondary | ICD-10-CM

## 2016-07-22 DIAGNOSIS — K828 Other specified diseases of gallbladder: Secondary | ICD-10-CM | POA: Diagnosis present

## 2016-07-22 DIAGNOSIS — I10 Essential (primary) hypertension: Secondary | ICD-10-CM | POA: Diagnosis not present

## 2016-07-22 DIAGNOSIS — Z79899 Other long term (current) drug therapy: Secondary | ICD-10-CM | POA: Insufficient documentation

## 2016-07-22 DIAGNOSIS — K811 Chronic cholecystitis: Secondary | ICD-10-CM | POA: Insufficient documentation

## 2016-07-22 DIAGNOSIS — J45909 Unspecified asthma, uncomplicated: Secondary | ICD-10-CM | POA: Insufficient documentation

## 2016-07-22 HISTORY — PX: CHOLECYSTECTOMY: SHX55

## 2016-07-22 SURGERY — LAPAROSCOPIC CHOLECYSTECTOMY
Anesthesia: General | Site: Abdomen

## 2016-07-22 MED ORDER — IOPAMIDOL (ISOVUE-300) INJECTION 61%
INTRAVENOUS | Status: DC | PRN
  Administered 2016-07-22: 1 mL

## 2016-07-22 MED ORDER — METOCLOPRAMIDE HCL 5 MG/ML IJ SOLN: 10.0000 mg | Freq: Once | INTRAMUSCULAR | Status: DC | PRN

## 2016-07-22 MED ORDER — PROPOFOL 10 MG/ML IV BOLUS
INTRAVENOUS | Status: DC | PRN
  Administered 2016-07-22: 170 mg via INTRAVENOUS

## 2016-07-22 MED ORDER — LACTATED RINGERS IV SOLN
INTRAVENOUS | Status: DC
  Administered 2016-07-22: 08:00:00 via INTRAVENOUS

## 2016-07-22 MED ORDER — SODIUM CHLORIDE 0.9 % IR SOLN
Status: DC | PRN
  Administered 2016-07-22: 1000 mL

## 2016-07-22 MED ORDER — MIDAZOLAM HCL 5 MG/5ML IJ SOLN
INTRAMUSCULAR | Status: DC | PRN
  Administered 2016-07-22: 2 mg via INTRAVENOUS

## 2016-07-22 MED ORDER — FENTANYL CITRATE (PF) 100 MCG/2ML IJ SOLN
INTRAMUSCULAR | Status: DC
Start: 2016-07-22 — End: 2016-07-22
  Filled 2016-07-22: qty 2

## 2016-07-22 MED ORDER — ONDANSETRON HCL 4 MG/2ML IJ SOLN
INTRAMUSCULAR | Status: AC
  Filled 2016-07-22: qty 2

## 2016-07-22 MED ORDER — CHLORHEXIDINE GLUCONATE CLOTH 2 % EX PADS: 6.0000 | MEDICATED_PAD | Freq: Once | CUTANEOUS | Status: DC

## 2016-07-22 MED ORDER — LIDOCAINE HCL (CARDIAC) 20 MG/ML IV SOLN
INTRAVENOUS | Status: DC | PRN
  Administered 2016-07-22: 100 mg via INTRAVENOUS

## 2016-07-22 MED ORDER — BUPIVACAINE HCL (PF) 0.25 % IJ SOLN
INTRAMUSCULAR | Status: DC | PRN
  Administered 2016-07-22: 17 mL

## 2016-07-22 MED ORDER — 0.9 % SODIUM CHLORIDE (POUR BTL) OPTIME
TOPICAL | Status: DC | PRN
  Administered 2016-07-22: 1000 mL

## 2016-07-22 MED ORDER — ONDANSETRON HCL 4 MG/2ML IJ SOLN
INTRAMUSCULAR | Status: DC | PRN
  Administered 2016-07-22: 4 mg via INTRAVENOUS

## 2016-07-22 MED ORDER — PROPOFOL 10 MG/ML IV BOLUS
INTRAVENOUS | Status: AC
  Filled 2016-07-22: qty 20

## 2016-07-22 MED ORDER — OXYCODONE HCL 5 MG PO TABS: 5.0000 mg | ORAL_TABLET | Freq: Four times a day (QID) | ORAL | 0 refills | Status: DC | PRN

## 2016-07-22 MED ORDER — BUPIVACAINE HCL (PF) 0.25 % IJ SOLN
INTRAMUSCULAR | Status: AC
  Filled 2016-07-22: qty 30

## 2016-07-22 MED ORDER — MEPERIDINE HCL 25 MG/ML IJ SOLN: 6.2500 mg | INTRAMUSCULAR | Status: DC | PRN

## 2016-07-22 MED ORDER — LACTATED RINGERS IV SOLN: INTRAVENOUS | Status: DC

## 2016-07-22 MED ORDER — FENTANYL CITRATE (PF) 100 MCG/2ML IJ SOLN
INTRAMUSCULAR | Status: DC | PRN
  Administered 2016-07-22: 50 ug via INTRAVENOUS

## 2016-07-22 MED ORDER — FENTANYL CITRATE (PF) 100 MCG/2ML IJ SOLN
25.0000 ug | INTRAMUSCULAR | Status: DC | PRN
  Administered 2016-07-22 (×2): 50 ug via INTRAVENOUS

## 2016-07-22 MED ORDER — ROCURONIUM BROMIDE 100 MG/10ML IV SOLN
INTRAVENOUS | Status: DC | PRN
  Administered 2016-07-22: 50 mg via INTRAVENOUS

## 2016-07-22 MED ORDER — LIDOCAINE 2% (20 MG/ML) 5 ML SYRINGE
INTRAMUSCULAR | Status: AC
  Filled 2016-07-22: qty 5

## 2016-07-22 MED ORDER — SUGAMMADEX SODIUM 500 MG/5ML IV SOLN
INTRAVENOUS | Status: AC
  Filled 2016-07-22: qty 5

## 2016-07-22 MED ORDER — SUGAMMADEX SODIUM 200 MG/2ML IV SOLN
INTRAVENOUS | Status: DC | PRN
  Administered 2016-07-22: 500 mg via INTRAVENOUS

## 2016-07-22 MED ORDER — OXYCODONE HCL 5 MG PO TABS
ORAL_TABLET | ORAL | Status: AC
  Filled 2016-07-22: qty 2

## 2016-07-22 MED ORDER — MIDAZOLAM HCL 2 MG/2ML IJ SOLN
INTRAMUSCULAR | Status: AC
  Filled 2016-07-22: qty 2

## 2016-07-22 MED ORDER — FENTANYL CITRATE (PF) 100 MCG/2ML IJ SOLN
INTRAMUSCULAR | Status: AC
  Filled 2016-07-22: qty 2

## 2016-07-22 MED ORDER — OXYCODONE HCL 5 MG PO TABS
5.0000 mg | ORAL_TABLET | ORAL | Status: DC | PRN
  Administered 2016-07-22: 10 mg via ORAL
  Filled 2016-07-22: qty 2

## 2016-07-22 MED ORDER — CEFAZOLIN SODIUM-DEXTROSE 2-4 GM/100ML-% IV SOLN
2.0000 g | INTRAVENOUS | Status: AC
  Administered 2016-07-22: 2 g via INTRAVENOUS
  Filled 2016-07-22: qty 100

## 2016-07-22 MED ORDER — IOPAMIDOL (ISOVUE-300) INJECTION 61%
INTRAVENOUS | Status: AC
  Filled 2016-07-22: qty 50

## 2016-07-22 SURGICAL SUPPLY — 45 items
ADH SKN CLS APL DERMABOND .7 (GAUZE/BANDAGES/DRESSINGS) ×2
APPLIER CLIP 5 13 M/L LIGAMAX5 (MISCELLANEOUS) ×3
APR CLP MED LRG 5 ANG JAW (MISCELLANEOUS) ×2
BAG SPEC RTRVL 10 TROC 200 (ENDOMECHANICALS) ×2
BLADE SURG ROTATE 9660 (MISCELLANEOUS) IMPLANT
CANISTER SUCTION 2500CC (MISCELLANEOUS) ×3 IMPLANT
CHLORAPREP W/TINT 26ML (MISCELLANEOUS) ×3 IMPLANT
CLIP APPLIE 5 13 M/L LIGAMAX5 (MISCELLANEOUS) ×2 IMPLANT
COVER MAYO STAND STRL (DRAPES) ×3 IMPLANT
COVER SURGICAL LIGHT HANDLE (MISCELLANEOUS) ×3 IMPLANT
DERMABOND ADVANCED (GAUZE/BANDAGES/DRESSINGS) ×1
DERMABOND ADVANCED .7 DNX12 (GAUZE/BANDAGES/DRESSINGS) ×2 IMPLANT
DRAPE C-ARM 42X72 X-RAY (DRAPES) ×1 IMPLANT
ELECT REM PT RETURN 9FT ADLT (ELECTROSURGICAL) ×3
ELECTRODE REM PT RTRN 9FT ADLT (ELECTROSURGICAL) ×2 IMPLANT
FILTER SMOKE EVAC LAPAROSHD (FILTER) ×2 IMPLANT
GLOVE BIO SURGEON STRL SZ8 (GLOVE) ×3 IMPLANT
GLOVE BIOGEL PI IND STRL 8 (GLOVE) ×2 IMPLANT
GLOVE BIOGEL PI INDICATOR 8 (GLOVE) ×1
GOWN STRL REUS W/ TWL LRG LVL3 (GOWN DISPOSABLE) ×5 IMPLANT
GOWN STRL REUS W/ TWL XL LVL3 (GOWN DISPOSABLE) ×3 IMPLANT
GOWN STRL REUS W/TWL LRG LVL3 (GOWN DISPOSABLE) ×9
GOWN STRL REUS W/TWL XL LVL3 (GOWN DISPOSABLE) ×6
KIT BASIN OR (CUSTOM PROCEDURE TRAY) ×3 IMPLANT
KIT ROOM TURNOVER OR (KITS) ×3 IMPLANT
L-HOOK LAP DISP 36CM (ELECTROSURGICAL) ×3
LHOOK LAP DISP 36CM (ELECTROSURGICAL) ×2 IMPLANT
NEEDLE 22X1 1/2 (OR ONLY) (NEEDLE) ×3 IMPLANT
NS IRRIG 1000ML POUR BTL (IV SOLUTION) ×3 IMPLANT
PAD ARMBOARD 7.5X6 YLW CONV (MISCELLANEOUS) ×3 IMPLANT
PENCIL BUTTON HOLSTER BLD 10FT (ELECTRODE) ×3 IMPLANT
POUCH RETRIEVAL ECOSAC 10 (ENDOMECHANICALS) ×2 IMPLANT
POUCH RETRIEVAL ECOSAC 10MM (ENDOMECHANICALS) ×1
SCISSORS LAP 5X35 DISP (ENDOMECHANICALS) ×3 IMPLANT
SET CHOLANGIOGRAPH 5 50 .035 (SET/KITS/TRAYS/PACK) ×1 IMPLANT
SET IRRIG TUBING LAPAROSCOPIC (IRRIGATION / IRRIGATOR) ×3 IMPLANT
SLEEVE ENDOPATH XCEL 5M (ENDOMECHANICALS) ×6 IMPLANT
SPECIMEN JAR SMALL (MISCELLANEOUS) ×3 IMPLANT
SUT VIC AB 4-0 PS2 27 (SUTURE) ×3 IMPLANT
TOWEL OR 17X24 6PK STRL BLUE (TOWEL DISPOSABLE) ×3 IMPLANT
TOWEL OR 17X26 10 PK STRL BLUE (TOWEL DISPOSABLE) ×1 IMPLANT
TRAY LAPAROSCOPIC MC (CUSTOM PROCEDURE TRAY) ×3 IMPLANT
TROCAR XCEL BLUNT TIP 100MML (ENDOMECHANICALS) ×3 IMPLANT
TROCAR XCEL NON-BLD 5MMX100MML (ENDOMECHANICALS) ×3 IMPLANT
TUBING INSUFFLATION (TUBING) ×3 IMPLANT

## 2016-07-22 NOTE — H&P (Signed)
Evelyn Chase is an 46 y.o. female.   Chief Complaint: RUQ pain HPI: Evelyn Chase presents for lap chole/IOC for biliary dyskinesia.  Past Medical History:  Diagnosis Date  . Allergic rhinitis   . Asthma   . Chronic sinusitis with recurrent bronchitis   . Hypertension   . Pneumonia    x 2     Past Surgical History:  Procedure Laterality Date  . BREAST ENHANCEMENT SURGERY    . KNEE SURGERY     right  . SHOULDER SURGERY     right  . TENDON RELEASE     Right hand    Family History  Problem Relation Age of Onset  . Asthma Father   . Emphysema Father   . Diabetes Father   . Lung cancer Mother    Social History:  reports that she has never smoked. She has never used smokeless tobacco. She reports that she drinks alcohol. She reports that she does not use drugs.  Allergies:  Allergies  Allergen Reactions  . Codeine     REACTION: itching    Medications Prior to Admission  Medication Sig Dispense Refill  . Ascorbic Acid (VITAMIN C) 500 MG CAPS Take 1 tablet by mouth 2 (two) times daily.    . calcium carbonate (OS-CAL) 600 MG TABS tablet Take 600 mg by mouth daily.    Marland Kitchen desogestrel-ethinyl estradiol (APRI,EMOQUETTE,SOLIA) 0.15-30 MG-MCG tablet Take 1 tablet by mouth daily.    Marland Kitchen EPINEPHrine (EPI-PEN) 0.3 mg/0.3 mL SOAJ injection Inject into thigh for severe allergic reaction 1 Device prn  . fexofenadine (ALLEGRA) 180 MG tablet Take 180 mg by mouth daily.    . metoprolol (TOPROL-XL) 200 MG 24 hr tablet Take 200 mg by mouth daily.     . Multiple Vitamin (MULTIVITAMIN) tablet Take 1 tablet by mouth daily.    . ranitidine (ZANTAC) 300 MG tablet TAKE 1 TABLET AT BEDTIME    . albuterol (PROAIR HFA) 108 (90 BASE) MCG/ACT inhaler Inhale 2 puffs into the lungs 4 (four) times daily as needed. 1 Inhaler prn  . Azelastine-Fluticasone (DYMISTA) 137-50 MCG/ACT SUSP Place 2 puffs into the nose daily. (Patient taking differently: Place 2 sprays into the nose as needed. ) 69 g 3     No results found for this or any previous visit (from the past 48 hour(s)). No results found.  ROS  Blood pressure 121/79, pulse 82, temperature 97.7 F (36.5 C), temperature source Oral, resp. rate 20, weight 78.9 kg (174 lb), last menstrual period 07/01/2016, SpO2 98 %. Physical Exam  Constitutional: She is oriented to person, place, and time. She appears well-developed and well-nourished.  HENT:  Head: Normocephalic.  Eyes: EOM are normal. Pupils are equal, round, and reactive to light.  Neck: Neck supple.  Cardiovascular: Normal rate and normal heart sounds.   Respiratory: Effort normal and breath sounds normal. No respiratory distress.  GI: Soft. She exhibits no distension. There is no tenderness. There is no rebound.  Musculoskeletal: Normal range of motion.  Neurological: She is alert and oriented to person, place, and time.  Skin: Skin is warm.  Psychiatric: She has a normal mood and affect.     Assessment/Plan Biliary dyskinesia - for laparoscopic cholecystectomy/cholangiogram. Procedure, risks, and benefits again discussed. She agrees.  Zenovia Jarred, MD 07/22/2016, 8:31 AM

## 2016-07-22 NOTE — Anesthesia Procedure Notes (Signed)
Procedures

## 2016-07-22 NOTE — Op Note (Signed)
07/22/2016  9:59 AM  PATIENT:  Evelyn Chase  46 y.o. female  PRE-OPERATIVE DIAGNOSIS:  BILIARY DYSKINESIA  POST-OPERATIVE DIAGNOSIS:  BILIARY DYSKINESIA  PROCEDURE:  Procedure(s): LAPAROSCOPIC CHOLECYSTECTOMY  SURGEON:  Georganna Skeans, M.D.  ASSISTANTS: Jens Som, M.D.   ANESTHESIA:   local and general  EBL:  No intake/output data recorded.  BLOOD ADMINISTERED:none  DRAINS: none   SPECIMEN:  Excision  DISPOSITION OF SPECIMEN:  PATHOLOGY  COUNTS:  YES  DICTATION: .Dragon Dictation Findings: Evidence of chronic inflammation, cystic duct too small to perform cholangiogram  Procedure in detail: [name] presents for laparoscopic cholecystectomy. She was identified in the preop holding area. Informed consent was obtained. She received intravenous antibiotics. She was brought to the operating room and general endotracheal anesthesia was administered by the anesthesia staff. Her abdomen was prepped and draped in sterile fashion. We did a time out procedure.The infraumbilical region was infiltrated with local. Infraumbilical incision was made. Subcutaneous tissues were dissected down revealing the anterior fascia. This was divided sharply along the midline. Peritoneal cavity was entered under direct vision without complication. A 0 Vicryl pursestring was placed around the fascial opening. Hassan trocar was inserted into the abdomen. The abdomen was insufflated with carbon dioxide in standard fashion. Under direct vision a 5 mm epigastric and 5 mm right lateral ports 2 were placed. Local was used at each port site. Exploration revealed multiple omental adhesions to the gallbladder. The dome of the gallbladder was retracted superior medially. These adhesions were swept away revealing the infundibulum. This was retracted inferior laterally. Dissection began laterally and progressed medially easily identifying the cystic duct and cystic artery. Cystic artery was clipped twice  proximally, once distally and divided. Cystic duct was further dissected until we had a critical view between the liver, the infundibulum, and the cystic duct. Once this was accomplished, it was decided that the cystic duct was too small to perform a cholangiogram. 3 clips were placed proximally on it. One was placed distally and it was divided. Gallbladder was removed from the liver bed achieving excellent hemostasis along the way. It was placed in a bag and removed from the abdomen. The liver bed was cauterized to get good hemostasis. The area was copiously irrigated. Clips remain in good position. Liver bed was dry. Irrigation was evacuated. Ports were removed under direct vision. Pneumoperitoneum was released. Informed local fascia was closed by tying the pursestring. All 4 wounds were copiously irrigated and the skin of each was closed with running 4 Vicryl subcuticular followed by Dermabond. All counts were correct. She tolerated the procedure well without apparent complications and was taken recovery in stable condition.  PATIENT DISPOSITION:  PACU - hemodynamically stable.   Delay start of Pharmacological VTE agent (>24hrs) due to surgical blood loss or risk of bleeding:  no  Georganna Skeans, MD, MPH, FACS Pager: [phone]  1/26/20189:59 AM

## 2016-07-22 NOTE — Anesthesia Postprocedure Evaluation (Signed)
Anesthesia Post Note  Patient: Evelyn Chase  Procedure(s) Performed: Procedure(s) (LRB): LAPAROSCOPIC CHOLECYSTECTOMY (N/A)  Patient location during evaluation: PACU Anesthesia Type: General Level of consciousness: awake and alert Pain management: pain level controlled Vital Signs Assessment: post-procedure vital signs reviewed and stable Respiratory status: spontaneous breathing, nonlabored ventilation, respiratory function stable and patient connected to nasal cannula oxygen Cardiovascular status: blood pressure returned to baseline and stable Postop Assessment: no signs of nausea or vomiting Anesthetic complications: no       Last Vitals:  Vitals:   07/22/16 1100 07/22/16 1130  BP: (!) 150/99 137/81  Pulse: 89 87  Resp: 16 (!) 24  Temp:  36.1 C    Last Pain:  Vitals:   07/22/16 1053  TempSrc:   PainSc: 8                  Montez Hageman

## 2016-07-22 NOTE — Interval H&P Note (Signed)
History and Physical Interval Note:  07/22/2016 8:33 AM  Evelyn Chase  has presented today for surgery, with the diagnosis of BILIARY DYSKINESIA  The various methods of treatment have been discussed with the patient and family. After consideration of risks, benefits and other options for treatment, the patient has consented to  Procedure(s): LAPAROSCOPIC CHOLECYSTECTOMY WITH INTRAOPERATIVE CHOLANGIOGRAM (N/A) as a surgical intervention .  The patient's history has been reviewed, patient examined, no change in status, stable for surgery.  I have reviewed the patient's chart and labs.  Questions were answered to the patient's satisfaction.     Ion,Jehu Mccauslin E

## 2016-07-22 NOTE — Anesthesia Procedure Notes (Signed)
Procedure Name: Intubation Date/Time: 07/22/2016 9:02 AM Performed by: Kyung Rudd Pre-anesthesia Checklist: Patient identified, Emergency Drugs available, Suction available and Patient being monitored Patient Re-evaluated:Patient Re-evaluated prior to inductionOxygen Delivery Method: Circle system utilized Preoxygenation: Pre-oxygenation with 100% oxygen Intubation Type: IV induction Ventilation: Mask ventilation without difficulty Laryngoscope Size: Mac and 3 Grade View: Grade I Tube type: Oral Tube size: 7.0 mm Number of attempts: 1 Airway Equipment and Method: Stylet Placement Confirmation: ETT inserted through vocal cords under direct vision,  positive ETCO2 and breath sounds checked- equal and bilateral Secured at: 21 cm Tube secured with: Tape Dental Injury: Teeth and Oropharynx as per pre-operative assessment

## 2016-07-22 NOTE — Anesthesia Preprocedure Evaluation (Addendum)
Anesthesia Evaluation  Patient identified by MRN, date of birth, ID band Patient awake    Reviewed: Allergy & Precautions, NPO status , Patient's Chart, lab work & pertinent test results  Airway Mallampati: II  TM Distance: >3 FB Neck ROM: Full    Dental no notable dental hx. (+) Teeth Intact   Pulmonary asthma ,    Pulmonary exam normal breath sounds clear to auscultation       Cardiovascular hypertension, Pt. on medications Normal cardiovascular exam Rhythm:Regular Rate:Normal     Neuro/Psych negative neurological ROS  negative psych ROS   GI/Hepatic negative GI ROS, Neg liver ROS,   Endo/Other  negative endocrine ROS  Renal/GU negative Renal ROS  negative genitourinary   Musculoskeletal negative musculoskeletal ROS (+)   Abdominal   Peds negative pediatric ROS (+)  Hematology negative hematology ROS (+)   Anesthesia Other Findings   Reproductive/Obstetrics negative OB ROS                           Anesthesia Physical Anesthesia Plan  ASA: II  Anesthesia Plan: General   Post-op Pain Management:    Induction: Intravenous  Airway Management Planned:   Additional Equipment:   Intra-op Plan:   Post-operative Plan: Extubation in OR  Informed Consent: I have reviewed the patients History and Physical, chart, labs and discussed the procedure including the risks, benefits and alternatives for the proposed anesthesia with the patient or authorized representative who has indicated his/her understanding and acceptance.   Dental advisory given  Plan Discussed with: CRNA  Anesthesia Plan Comments:         Anesthesia Quick Evaluation

## 2016-07-22 NOTE — Transfer of Care (Signed)
Immediate Anesthesia Transfer of Care Note  Patient: Evelyn Chase  Procedure(s) Performed: Procedure(s): LAPAROSCOPIC CHOLECYSTECTOMY (N/A)  Patient Location: PACU  Anesthesia Type:General  Level of Consciousness: awake, alert  and oriented  Airway & Oxygen Therapy: Patient Spontanous Breathing and Patient connected to nasal cannula oxygen  Post-op Assessment: Report given to RN, Post -op Vital signs reviewed and stable and Patient moving all extremities X 4  Post vital signs: Reviewed and stable  Last Vitals:  Vitals:   07/22/16 0718 07/22/16 0958  BP: 121/79   Pulse: 82   Resp: 20   Temp: 36.5 C (P) 36.4 C    Last Pain:  Vitals:   07/22/16 0742  TempSrc: Oral      Patients Stated Pain Goal: 7 ([id number])  Complications: No apparent anesthesia complications

## 2016-07-22 NOTE — Progress Notes (Signed)
This patient's record was reviewed on the [place] Controlled Substances Database. Georganna Skeans, MD, MPH, FACS Trauma: [phone] General Surgery: [phone]

## 2016-07-23 ENCOUNTER — Encounter (HOSPITAL_COMMUNITY): Payer: Self-pay | Admitting: General Surgery

## 2017-11-02 DIAGNOSIS — E669 Obesity, unspecified: Secondary | ICD-10-CM | POA: Diagnosis not present

## 2017-11-02 DIAGNOSIS — Z713 Dietary counseling and surveillance: Secondary | ICD-10-CM | POA: Diagnosis not present

## 2017-11-06 ENCOUNTER — Ambulatory Visit: Payer: 59 | Admitting: Family Medicine

## 2017-11-06 ENCOUNTER — Other Ambulatory Visit: Payer: Self-pay

## 2017-11-06 ENCOUNTER — Encounter: Payer: Self-pay | Admitting: Emergency Medicine

## 2017-11-06 VITALS — BP 132/86 | HR 90 | Temp 98.6°F | Ht 63.0 in | Wt 180.6 lb

## 2017-11-06 DIAGNOSIS — Z3041 Encounter for surveillance of contraceptive pills: Secondary | ICD-10-CM | POA: Insufficient documentation

## 2017-11-06 DIAGNOSIS — I10 Essential (primary) hypertension: Secondary | ICD-10-CM | POA: Diagnosis not present

## 2017-11-06 DIAGNOSIS — R000 Tachycardia, unspecified: Secondary | ICD-10-CM | POA: Insufficient documentation

## 2017-11-06 DIAGNOSIS — K529 Noninfective gastroenteritis and colitis, unspecified: Secondary | ICD-10-CM | POA: Diagnosis not present

## 2017-11-06 DIAGNOSIS — J45991 Cough variant asthma: Secondary | ICD-10-CM

## 2017-11-06 DIAGNOSIS — E038 Other specified hypothyroidism: Secondary | ICD-10-CM | POA: Insufficient documentation

## 2017-11-06 DIAGNOSIS — E039 Hypothyroidism, unspecified: Secondary | ICD-10-CM | POA: Diagnosis not present

## 2017-11-06 DIAGNOSIS — I4711 Inappropriate sinus tachycardia, so stated: Secondary | ICD-10-CM | POA: Insufficient documentation

## 2017-11-06 DIAGNOSIS — E669 Obesity, unspecified: Secondary | ICD-10-CM | POA: Insufficient documentation

## 2017-11-06 DIAGNOSIS — H9193 Unspecified hearing loss, bilateral: Secondary | ICD-10-CM | POA: Insufficient documentation

## 2017-11-06 DIAGNOSIS — D369 Benign neoplasm, unspecified site: Secondary | ICD-10-CM

## 2017-11-06 HISTORY — DX: Obesity, unspecified: E66.9

## 2017-11-06 HISTORY — DX: Noninfective gastroenteritis and colitis, unspecified: K52.9

## 2017-11-06 HISTORY — DX: Benign neoplasm, unspecified site: D36.9

## 2017-11-06 HISTORY — DX: Other specified hypothyroidism: E03.8

## 2017-11-06 NOTE — Patient Instructions (Addendum)
Please return in 1-3 months for your annual complete physical; please come fasting. I will check your blood work at that time including your thyroid studies and vitamin levels.   Start weight training. This will help your weight management as well.   It was a pleasure meeting you today! Thank you for choosing [place] to meet your healthcare needs! I truly look forward to working with you. If you have any questions or concerns, please send me a message via Mychart or call the office at [phone].

## 2017-11-06 NOTE — Progress Notes (Signed)
Subjective  CC:  Chief Complaint  Patient presents with  . Establish Care    Transfer from [facility], last physical 01/2015  . Hypertension   Reviewed most recent labs 04/2017; TSH 8, cmp, cbc, lipids normal. Reviewed cardiology and pcp office notes from care everywhere.   HPI: Evelyn Chase is a 47 y.o. female who presents to [place] at [facility] today to establish care with me as a new patient.   She has the following concerns or needs:  Very pleasant perimenopausal G0 married female on OCPs for birth control with controlled htn, inappropriate sinus tach controlled with meds, subclinical hypothyroidism, obesity and chronic diarrhea and h/o colonic polyps managed by GI presents without new needs. Due for CPE. Female wellness is up to date.   HTN is controlled on multiple meds. Sinus tach with nl echo in January by cards and controlled with meds. Asymptomatic. No h/o hyperlipidemia; nonsmoker, no diabetes.   Obesity: working with nutritionist now. Little exercise.   Cough variant asthma; rare albuterol use. Triggers include allergies; perennial on allergy meds; evaluated and managed by Dr. Keturah Barre in the past.   Subclinical hypothyroidism. Due for recheck. No sxs of low thyroid now although it is hard for her to lose weight.   GI: has had multiple colonoscopies for chronic diarrhea, unclear dx. Has adenomatous polyps as well.   Last Pap nl with neg HPV, 2016.  We updated and reviewed the patient's past history in detail and it is documented below.  Patient Active Problem List   Diagnosis Date Noted  . Adenomatous polyp 11/06/2017    Priority: High    Dr. Watt Climes, q 3 year colonoscopy surveillance   . Essential hypertension 02/28/2012    Priority: High  . Inappropriate sinus tachycardia 11/06/2017    Priority: Medium  . Oral contraceptive use 11/06/2017    Priority: Medium  . Subclinical hypothyroidism 11/06/2017    Priority:  Medium  . Chronic diarrhea 11/06/2017    Priority: Medium    ? Malabsorption. Requesting records from GI.   Marland Kitchen Obesity (BMI 30-39.9) 11/06/2017    Priority: Medium    Seeing a nutritionist 2019   . Cough variant asthma 07/06/2007    Priority: Medium    Allergies are trigger   . 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 Seasonal and perennial allergic rhinitis 07/06/2007    Priority: Low    Allergy vaccine 1:50,000 07/25/2007, 1:10 06/16/2008, [facility]     Health Maintenance  Topic Date Due  . HIV Screening  08/06/1984  . INFLUENZA VACCINE  01/25/2018  . MAMMOGRAM  06/27/2018  . PAP SMEAR  02/24/2020  . TETANUS/TDAP  11/09/2022   Immunization History  Administered Date(s) Administered  . Influenza Split 03/27/2012, 03/27/2013, 04/06/2014, 03/28/2015  . Influenza, Seasonal, Injecte, Preservative Fre 03/28/2015, 03/31/2016  . Pneumococcal Polysaccharide-23 02/24/2015, 04/28/2015  . Tdap 11/08/2012   Current Meds  Medication Sig  . albuterol (PROAIR HFA) 108 (90 BASE) MCG/ACT inhaler Inhale 2 puffs into the lungs 4 (four) times daily as needed.  Marland Kitchen amLODipine (NORVASC) 5 MG tablet Take by mouth.  . Ascorbic Acid (VITAMIN C) 500 MG CAPS Take 1 tablet by mouth 2 (two) times daily.  . calcium carbonate (OS-CAL) 600 MG TABS tablet Take 600 mg by mouth daily.  Marland Kitchen desogestrel-ethinyl estradiol (APRI,EMOQUETTE,SOLIA) 0.15-30 MG-MCG tablet Take 1 tablet by mouth daily.  Marland Kitchen  EPINEPHrine (EPI-PEN) 0.3 mg/0.3 mL SOAJ injection Inject into thigh for severe allergic reaction  . fexofenadine (ALLEGRA) 180 MG tablet Take 180 mg by mouth daily.  . ivabradine (CORLANOR) 5 MG TABS tablet Take 5 mg by mouth 2 (two) times daily with a meal.  . losartan (COZAAR) 100 MG tablet TAKE 1 TABLET BY MOUTH  DAILY  . metoprolol (TOPROL-XL) 200 MG 24 hr tablet Take 200 mg by mouth daily.   . Multiple Vitamin (MULTIVITAMIN)  tablet Take 1 tablet by mouth daily.  . ranitidine (ZANTAC) 300 MG tablet TAKE 1 TABLET AT BEDTIME    Allergies: Patient is allergic to codeine. Past Medical History Patient  has a past medical history of Adenomatous polyp (11/06/2017), Allergic rhinitis, Asthma, Chronic diarrhea (11/06/2017), Chronic sinusitis with recurrent bronchitis, Hypertension, Obesity (BMI 30-39.9) (11/06/2017), Pneumonia, and Subclinical hypothyroidism (11/06/2017). Past Surgical History Patient  has a past surgical history that includes Breast enhancement surgery; Knee surgery; Tendon release; Shoulder surgery; Cholecystectomy (N/A, 07/22/2016); Finger surgery; and bilateral tempromandibular joint arthroplasty. Family History: Patient family history includes Arthritis in her father; Asthma in her father; COPD in her father; Dementia in her mother; Diabetes in her father; Emphysema in her father; Heart attack in her father; Heart disease in her father; Hyperlipidemia in her father; Hypertension in her father; Lung cancer (age of onset: 65) in her mother; Tuberculosis in her father. Social History:  Patient  reports that she has never smoked. She has never used smokeless tobacco. She reports that she drinks alcohol. She reports that she does not use drugs.  Review of Systems: Constitutional: negative for fever or malaise Ophthalmic: negative for photophobia, double vision or loss of vision Cardiovascular: negative for chest pain, dyspnea on exertion, or new LE swelling Respiratory: negative for SOB or persistent cough Gastrointestinal: negative for abdominal pain, change in bowel habits or melena Genitourinary: negative for dysuria or gross hematuria Musculoskeletal: negative for new gait disturbance or muscular weakness Integumentary: negative for new or persistent rashes Neurological: negative for TIA or stroke symptoms Psychiatric: negative for SI or delusions Allergic/Immunologic: negative for hives  Patient Care  Team    Relationship Specialty Notifications Start End  Leamon Arnt, MD PCP - General Family Medicine  11/06/17   Clarene Essex, MD Consulting Physician Gastroenterology  11/06/17   Lorenda Cahill, DMD  Dentistry  11/06/17     Objective  Vitals: BP 132/86   Pulse 90   Temp 98.6 F (37 C)   Ht 5\' 3"  (1.6 m)   Wt 180 lb 9.6 oz (81.9 kg)   LMP 10/26/2017   BMI 31.99 kg/m  General:  Well developed, well nourished, no acute distress  Psych:  Alert and oriented,normal mood and affect HEENT:  Normocephalic, atraumatic, non-icteric sclera, PERRL, oropharynx is without mass or exudate, supple neck without adenopathy, mass or thyromegaly Cardiovascular:  RRR without gallop, rub or murmur, nondisplaced PMI Respiratory:  Good breath sounds bilaterally, CTAB with normal respiratory effort Gastrointestinal: normal bowel sounds, soft, non-tender, no noted masses. No HSM Skin:  Warm, no rashes or suspicious lesions noted Neurologic:    Mental status is normal. Gross motor and sensory exams are normal. Normal gait, no tremor  Assessment  1. Essential hypertension   2. Cough variant asthma   3. Oral contraceptive use   4. Subclinical hypothyroidism   5. Chronic diarrhea   6. Obesity (BMI 30-39.9)   7. Inappropriate sinus tachycardia      Plan   BP and tachycardia is controlled.  On multiple meds. Continue same meds for now. Consider f/u with cards due to ivabradine medication and atypical use?  Asthma is controlled.   ocps - may continue through age 1 then reassess need. No adverse effects.   Request records from GI. Will check vitamin levels due to possibility of malabsorption syndrome.   Recheck thyroid panel at f/u visit; low threshhold to start tx given trying to achieve weight loss.    Follow up:  Return in about 1 month (around 12/04/2017) for complete physical.  Commons side effects, risks, benefits, and alternatives for medications and treatment plan prescribed today were  discussed, and the patient expressed understanding of the given instructions. Patient is instructed to call or message via MyChart if he/she has any questions or concerns regarding our treatment plan. No barriers to understanding were identified. We discussed Red Flag symptoms and signs in detail. Patient expressed understanding regarding what to do in case of urgent or emergency type symptoms.   Medication list was reconciled, printed and provided to the patient in AVS. Patient instructions and summary information was reviewed with the patient as documented in the AVS. This note was prepared with assistance of Dragon voice recognition software. Occasional wrong-word or sound-a-like substitutions may have occurred due to the inherent limitations of voice recognition software  Orders Placed This Encounter  Procedures  . HM MAMMOGRAPHY  . HM PAP SMEAR   No orders of the defined types were placed in this encounter.

## 2017-11-14 ENCOUNTER — Ambulatory Visit: Payer: 59 | Admitting: Family Medicine

## 2017-11-14 ENCOUNTER — Encounter: Payer: Self-pay | Admitting: Family Medicine

## 2017-11-14 ENCOUNTER — Other Ambulatory Visit: Payer: Self-pay

## 2017-11-14 VITALS — BP 132/80 | HR 112 | Temp 98.8°F | Ht 63.0 in | Wt 181.4 lb

## 2017-11-14 DIAGNOSIS — J0100 Acute maxillary sinusitis, unspecified: Secondary | ICD-10-CM

## 2017-11-14 MED ORDER — BENZONATATE 100 MG PO CAPS: 100.0000 mg | ORAL_CAPSULE | Freq: Two times a day (BID) | ORAL | 0 refills | Status: DC | PRN

## 2017-11-14 MED ORDER — AMOXICILLIN 875 MG PO TABS: 875.0000 mg | ORAL_TABLET | Freq: Two times a day (BID) | ORAL | 0 refills | Status: AC

## 2017-11-14 NOTE — Progress Notes (Signed)
Subjective   CC:  Chief Complaint  Patient presents with  . URI    cough, sneezing and sinus pressure x 3 days     HPI: Evelyn Chase is a 47 y.o. female who presents to the office today to address the problems listed above in the chief complaint.  Patient reports sinus congestion and pressure with thick drainage, mild nonproductive cough, ear pressure without pain, and mild malaise.  Symptoms have been present for several days.Shedenies high fevers, GI symptoms, shortness of breath. Shehas had sinus infections in the past and this feels similar.  Patient is a non-smoker.  + history of asthma or COPD. No wheezing now. + ST intermittently as well. No strep exposures  I reviewed the patients updated PMH, FH, and SocHx.    Patient Active Problem List   Diagnosis Date Noted  . Adenomatous polyp 11/06/2017    Priority: High  . Essential hypertension 02/28/2012    Priority: High  . Inappropriate sinus tachycardia 11/06/2017    Priority: Medium  . Oral contraceptive use 11/06/2017    Priority: Medium  . Subclinical hypothyroidism 11/06/2017    Priority: Medium  . Chronic diarrhea 11/06/2017    Priority: Medium  . Obesity (BMI 30-39.9) 11/06/2017    Priority: Medium  . Cough variant asthma 07/06/2007    Priority: Medium  . Bilateral low frequency hearing loss 11/06/2017    Priority: Low  . Seasonal and perennial allergic rhinitis 07/06/2007    Priority: Low   Current Meds  Medication Sig  . albuterol (PROAIR HFA) 108 (90 BASE) MCG/ACT inhaler Inhale 2 puffs into the lungs 4 (four) times daily as needed.  Marland Kitchen amLODipine (NORVASC) 5 MG tablet Take by mouth.  . Ascorbic Acid (VITAMIN C) 500 MG CAPS Take 1 tablet by mouth 2 (two) times daily.  . calcium carbonate (OS-CAL) 600 MG TABS tablet Take 600 mg by mouth daily.  Marland Kitchen desogestrel-ethinyl estradiol (APRI,EMOQUETTE,SOLIA) 0.15-30 MG-MCG tablet Take 1 tablet by mouth daily.  Marland Kitchen EPINEPHrine (EPI-PEN) 0.3 mg/0.3 mL SOAJ  injection Inject into thigh for severe allergic reaction  . fexofenadine (ALLEGRA) 180 MG tablet Take 180 mg by mouth daily.  . ivabradine (CORLANOR) 5 MG TABS tablet Take 5 mg by mouth 2 (two) times daily with a meal.  . losartan (COZAAR) 100 MG tablet TAKE 1 TABLET BY MOUTH  DAILY  . metoprolol (TOPROL-XL) 200 MG 24 hr tablet Take 200 mg by mouth daily.   . Multiple Vitamin (MULTIVITAMIN) tablet Take 1 tablet by mouth daily.  . ranitidine (ZANTAC) 300 MG tablet TAKE 1 TABLET AT BEDTIME    Review of Systems: Cardiovascular: negative for chest pain Respiratory: negative for SOB or persistent cough Gastrointestinal: negative for abdominal pain Genitourinary: negative for dysuria or gross hematuria  Objective  Vitals: BP 132/80   Pulse (!) 112   Temp 98.8 F (37.1 C)   Ht 5\' 3"  (1.6 m)   Wt 181 lb 6.4 oz (82.3 kg)   LMP 10/26/2017   BMI 32.13 kg/m  General: no acute distress  Psych:  Alert and oriented, normal mood and affect HEENT:  Normocephalic, atraumatic, TMs with serous effusions or retraction w/o erythema, nasal mucosa is red with purulent drainage, tender maxillary sinus present, OP mild erythematous w/o eudate, supple neck with LAD Cardiovascular:  RRR without murmur or gallop. no peripheral edema Respiratory:  Good breath sounds bilaterally, CTAB with normal respiratory effort Skin:  Warm, no rashes Neurologic:   Mental status is normal. normal  gait  Assessment  1. Acute non-recurrent maxillary sinusitis      Plan    Sinusitis: History and exam is most consistent with bacterial sinus infection.  Etiology and prognosis discussed with patient.  Recommend antibiotics as ordered below.  Patient to complete course of antibiotics, use supportive medications like mucolytics and decongestants as needed.  May use Tylenol or Advil if needed.  Symptoms should improve over the next 2 weeks.  Patient will return or call if symptoms persist or worsen.  Follow up: prn    Commons  side effects, risks, benefits, and alternatives for medications and treatment plan prescribed today were discussed, and the patient expressed understanding of the given instructions. Patient is instructed to call or message via MyChart if he/she has any questions or concerns regarding our treatment plan. No barriers to understanding were identified. We discussed Red Flag symptoms and signs in detail. Patient expressed understanding regarding what to do in case of urgent or emergency type symptoms.   Medication list was reconciled, printed and provided to the patient in AVS. Patient instructions and summary information was reviewed with the patient as documented in the AVS. This note was prepared with assistance of Dragon voice recognition software. Occasional wrong-word or sound-a-like substitutions may have occurred due to the inherent limitations of voice recognition software  Orders Placed This Encounter  Procedures  . HM COLONOSCOPY   Meds ordered this encounter  Medications  . amoxicillin (AMOXIL) 875 MG tablet    Sig: Take 1 tablet (875 mg total) by mouth 2 (two) times daily for 7 days.    Dispense:  14 tablet    Refill:  0  . benzonatate (TESSALON) 100 MG capsule    Sig: Take 1 capsule (100 mg total) by mouth 2 (two) times daily as needed for cough.    Dispense:  20 capsule    Refill:  0

## 2017-11-14 NOTE — Patient Instructions (Signed)
Please return as scheduled.   You may use Advil 2-3 up to three times a day. You may use Delsym cough syrup or Mucinex DM to help with congestion and coughing.   If you have any questions or concerns, please don't hesitate to send me a message via MyChart or call the office at [phone]. Thank you for visiting with [place] today! It's our pleasure caring for you.   Sinusitis, Adult Sinusitis is soreness and inflammation of your sinuses. Sinuses are hollow spaces in the bones around your face. They are located:  Around your eyes.  In the middle of your forehead.  Behind your nose.  In your cheekbones.  Your sinuses and nasal passages are lined with a stringy fluid (mucus). Mucus normally drains out of your sinuses. When your nasal tissues get inflamed or swollen, the mucus can get trapped or blocked so air cannot flow through your sinuses. This lets bacteria, viruses, and funguses grow, and that leads to infection. Follow these instructions at home: Medicines  Take, use, or apply over-the-counter and prescription medicines only as told by your doctor. These may include nasal sprays.  If you were prescribed an antibiotic medicine, take it as told by your doctor. Do not stop taking the antibiotic even if you start to feel better. Hydrate and Humidify  Drink enough water to keep your pee (urine) clear or pale yellow.  Use a cool mist humidifier to keep the humidity level in your home above 50%.  Breathe in steam for 10-15 minutes, 3-4 times a day or as told by your doctor. You can do this in the bathroom while a hot shower is running.  Try not to spend time in cool or dry air. Rest  Rest as much as possible.  Sleep with your head raised (elevated).  Make sure to get enough sleep each night. General instructions  Put a warm, moist washcloth on your face 3-4 times a day or as told by your doctor. This will help with discomfort.  Wash your hands often with soap and water. If  there is no soap and water, use hand sanitizer.  Do not smoke. Avoid being around people who are smoking (secondhand smoke).  Keep all follow-up visits as told by your doctor. This is important. Contact a doctor if:  You have a fever.  Your symptoms get worse.  Your symptoms do not get better within 10 days. Get help right away if:  You have a very bad headache.  You cannot stop throwing up (vomiting).  You have pain or swelling around your face or eyes.  You have trouble seeing.  You feel confused.  Your neck is stiff.  You have trouble breathing. This information is not intended to replace advice given to you by your health care provider. Make sure you discuss any questions you have with your health care provider. Document Released: 11/30/2007 Document Revised: 02/07/2016 Document Reviewed: 04/08/2015 Elsevier Interactive Patient Education  [employer].

## 2017-11-16 ENCOUNTER — Encounter: Payer: Self-pay | Admitting: Family Medicine

## 2017-11-17 MED ORDER — HYDROCODONE-HOMATROPINE 5-1.5 MG/5ML PO SYRP: 5.0000 mL | ORAL_SOLUTION | Freq: Three times a day (TID) | ORAL | 0 refills | Status: DC | PRN

## 2017-11-26 DIAGNOSIS — J0101 Acute recurrent maxillary sinusitis: Secondary | ICD-10-CM | POA: Diagnosis not present

## 2017-12-04 ENCOUNTER — Encounter: Payer: Self-pay | Admitting: Family Medicine

## 2017-12-04 ENCOUNTER — Other Ambulatory Visit: Payer: Self-pay

## 2017-12-04 ENCOUNTER — Ambulatory Visit (INDEPENDENT_AMBULATORY_CARE_PROVIDER_SITE_OTHER): Payer: 59

## 2017-12-04 ENCOUNTER — Ambulatory Visit: Payer: 59 | Admitting: Family Medicine

## 2017-12-04 VITALS — BP 118/78 | HR 99 | Temp 98.3°F | Ht 63.0 in | Wt 180.0 lb

## 2017-12-04 DIAGNOSIS — M778 Other enthesopathies, not elsewhere classified: Secondary | ICD-10-CM

## 2017-12-04 DIAGNOSIS — M779 Enthesopathy, unspecified: Secondary | ICD-10-CM

## 2017-12-04 DIAGNOSIS — M79672 Pain in left foot: Secondary | ICD-10-CM | POA: Diagnosis not present

## 2017-12-04 MED ORDER — DICLOFENAC SODIUM 75 MG PO TBEC: 75.0000 mg | DELAYED_RELEASE_TABLET | Freq: Two times a day (BID) | ORAL | 0 refills | Status: DC

## 2017-12-04 NOTE — Progress Notes (Signed)
Subjective  CC:  Chief Complaint  Patient presents with  . Foot Pain    Left Foot pain, side of foot and last 3 toes x 5 days     HPI: Evelyn Chase is a 47 y.o. female who presents to the office today to address the problems listed above in the chief complaint.  Evelyn Chase complains of left foot pain with walking or standing.  She is pain-free if she is off of her feet.  No problems sleeping.  Started within the last week.  No known trauma.  She reports walking for exercise over the last 2 to 3 months, now actively reaching 10-12,000 steps per day.  No recent change in her activity level however.  She is noticed mild swelling over the dorsum of the foot without redness, warmth.  She is taken some Advil with minimal relief in her symptoms.  No history of pain.  No heel pain.  No calf pain.  Assessment  No diagnosis found.   Plan   Extensor tendinitis due to overuse: Education given.  Start icing, resting i.e. no walking for the next 2 weeks, 2 weeks of anti-inflammatories and firm soled shoe to be worn.  Check x-ray to ensure no bony abnormalities.  She has follow-up within 2 weeks for complete physical, we will see how she is doing at that time.  Follow up: As above  No orders of the defined types were placed in this encounter.  No orders of the defined types were placed in this encounter.     I reviewed the patients updated PMH, FH, and SocHx.    Patient Active Problem List   Diagnosis Date Noted  . Adenomatous polyp 11/06/2017    Priority: High  . Essential hypertension 02/28/2012    Priority: High  . Inappropriate sinus tachycardia 11/06/2017    Priority: Medium  . Oral contraceptive use 11/06/2017    Priority: Medium  . Subclinical hypothyroidism 11/06/2017    Priority: Medium  . Chronic diarrhea 11/06/2017    Priority: Medium  . Obesity (BMI 30-39.9) 11/06/2017    Priority: Medium  . Cough variant asthma 07/06/2007    Priority: Medium  . Bilateral low  frequency hearing loss 11/06/2017    Priority: Low  . Seasonal and perennial allergic rhinitis 07/06/2007    Priority: Low   Current Meds  Medication Sig  . albuterol (PROAIR HFA) 108 (90 BASE) MCG/ACT inhaler Inhale 2 puffs into the lungs 4 (four) times daily as needed.  Marland Kitchen amLODipine (NORVASC) 5 MG tablet Take by mouth.  . Ascorbic Acid (VITAMIN C) 500 MG CAPS Take 1 tablet by mouth 2 (two) times daily.  Marland Kitchen azelastine (ASTELIN) 0.1 % nasal spray Place into the nose.  . calcium carbonate (OS-CAL) 600 MG TABS tablet Take 600 mg by mouth daily.  . cetirizine (ZYRTEC) 5 MG tablet Take by mouth.  . desogestrel-ethinyl estradiol (APRI,EMOQUETTE,SOLIA) 0.15-30 MG-MCG tablet Take 1 tablet by mouth daily.  Marland Kitchen EPINEPHrine (EPI-PEN) 0.3 mg/0.3 mL SOAJ injection Inject into thigh for severe allergic reaction  . fluticasone (FLONASE) 50 MCG/ACT nasal spray Place into the nose.  . ivabradine (CORLANOR) 5 MG TABS tablet Take 5 mg by mouth 2 (two) times daily with a meal.  . losartan (COZAAR) 100 MG tablet TAKE 1 TABLET BY MOUTH  DAILY  . metoprolol (TOPROL-XL) 200 MG 24 hr tablet Take 200 mg by mouth daily.   . Multiple Vitamin (MULTIVITAMIN) tablet Take 1 tablet by mouth daily.  . ranitidine (  ZANTAC) 300 MG tablet TAKE 1 TABLET AT BEDTIME  . [DISCONTINUED] fexofenadine (ALLEGRA) 180 MG tablet Take 180 mg by mouth daily.    Allergies: Patient is allergic to codeine. Family History: Patient family history includes Arthritis in her father; Asthma in her father; COPD in her father; Dementia in her mother; Diabetes in her father; Emphysema in her father; Heart attack in her father; Heart disease in her father; Hyperlipidemia in her father; Hypertension in her father; Lung cancer (age of onset: 94) in her mother; Tuberculosis in her father. Social History:  Patient  reports that she has never smoked. She has never used smokeless tobacco. She reports that she drinks alcohol. She reports that she does not use  drugs.  Review of Systems: Constitutional: Negative for fever malaise or anorexia Cardiovascular: negative for chest pain Respiratory: negative for SOB or persistent cough Gastrointestinal: negative for abdominal pain  Objective  Vitals: BP 118/78   Pulse 99   Temp 98.3 F (36.8 C)   Ht 5\' 3"  (1.6 m)   Wt 180 lb (81.6 kg)   LMP 11/30/2017   BMI 31.89 kg/m  General: no acute distress , A&Ox3, walking with a limp Left foot: Tender over the extensor tendons 3 through 5, no focal bony tenderness, no metatarsal head tenderness, mild swelling without redness or warmth, no heel tenderness Skin:  Warm, no rashes    Commons side effects, risks, benefits, and alternatives for medications and treatment plan prescribed today were discussed, and the patient expressed understanding of the given instructions. Patient is instructed to call or message via MyChart if he/she has any questions or concerns regarding our treatment plan. No barriers to understanding were identified. We discussed Red Flag symptoms and signs in detail. Patient expressed understanding regarding what to do in case of urgent or emergency type symptoms.   Medication list was reconciled, printed and provided to the patient in AVS. Patient instructions and summary information was reviewed with the patient as documented in the AVS. This note was prepared with assistance of Dragon voice recognition software. Occasional wrong-word or sound-a-like substitutions may have occurred due to the inherent limitations of voice recognition software

## 2017-12-04 NOTE — Patient Instructions (Signed)
Please go to our [facility] office to get your xrays done. You can walk in M-F between 8am and 5pm. Tell them you are there for xrays ordered by me. They will send me the results, then I will let you know the results with instructions.   Address: [place], [place], [place]  (office sits at [address] at [address] intersection; from here, turn left onto [place] [address]), take to [address], turn right and go for a mile or so, office will be on left across form [place] )   Sam Rayburn for Routine Care of Injuries Theroutine careofmanyinjuriesincludes rest, ice, compression, and elevation (RICE therapy). RICE therapy is often recommended for injuries to soft tissues, such as a muscle strain, ligament injuries, bruises, and overuse injuries. It can also be used for some bony injuries. Using RICE therapy can help to relieve pain, lessen swelling, and enable your body to heal. Rest Rest is required to allow your body to heal. This usually involves reducing your normal activities and avoiding use of the injured part of your body. Generally, you can return to your normal activities when you are comfortable and have been given permission by your health care provider. Follow these instructions at home: Ice  Icing your injury helps to keep the swelling down, and it lessens pain. Do not apply ice directly to your skin.  Put ice in a plastic bag.  Place a towel between your skin and the bag.  Leave the ice on for 20 minutes, 2-3 times a day.  Do this for as long as you are directed by your health care provider. Compression Compression means putting pressure on the injured area. Compression helps to keep swelling down, gives support, and helps with discomfort. Compression may be done with an elastic bandage. If an elastic bandage has been applied, follow these general tips:  Remove and reapply the bandage every 3-4 hours or as  directed by your health care provider.  Make sure the bandage is not wrapped too tightly, because this can cut off circulation. If part of your body beyond the bandage becomes blue, numb, cold, swollen, or more painful, your bandage is most likely too tight. If this occurs, remove your bandage and reapply it more loosely.  See your health care provider if the bandage seems to be making your problems worse rather than better.  Elevation  Elevation means keeping the injured area raised. This helps to lessen swelling and decrease pain. If possible, your injured area should be elevated at or above the level of your heart or the center of your chest. When should I seek medical care?  If your pain and swelling continue.  If your symptoms are getting worse rather than improving. These symptoms may indicate that further evaluation or further X-rays are needed. Sometimes, X-rays may not show a small broken bone (fracture) until a number of days later. Make a follow-up appointment with your health care provider. When should I seek immediate medical care?  If you have sudden severe pain at or below the area of your injury.  If you have redness or increased swelling around your injury.  If you have tingling or numbness at or below the area of your injury that does not improve after you remove the elastic bandage. This information is not intended to replace advice given to you by your health care provider. Make sure you discuss any questions you have with your health care  provider. Document Released: 09/25/2000 Document Revised: 07/25/2016 Document Reviewed: 05/21/2014 Elsevier Interactive Patient Education  [employer].

## 2017-12-07 ENCOUNTER — Encounter: Payer: Self-pay | Admitting: Family Medicine

## 2017-12-07 DIAGNOSIS — Z6830 Body mass index (BMI) 30.0-30.9, adult: Secondary | ICD-10-CM | POA: Diagnosis not present

## 2017-12-07 DIAGNOSIS — Z713 Dietary counseling and surveillance: Secondary | ICD-10-CM | POA: Diagnosis not present

## 2017-12-08 ENCOUNTER — Encounter: Payer: 59 | Admitting: Family Medicine

## 2017-12-13 ENCOUNTER — Ambulatory Visit (INDEPENDENT_AMBULATORY_CARE_PROVIDER_SITE_OTHER): Payer: 59 | Admitting: Family Medicine

## 2017-12-13 ENCOUNTER — Encounter: Payer: Self-pay | Admitting: Family Medicine

## 2017-12-13 ENCOUNTER — Other Ambulatory Visit: Payer: Self-pay

## 2017-12-13 VITALS — BP 118/78 | HR 80 | Temp 98.0°F | Ht 63.0 in | Wt 175.8 lb

## 2017-12-13 DIAGNOSIS — M778 Other enthesopathies, not elsewhere classified: Secondary | ICD-10-CM

## 2017-12-13 DIAGNOSIS — E669 Obesity, unspecified: Secondary | ICD-10-CM

## 2017-12-13 DIAGNOSIS — M779 Enthesopathy, unspecified: Secondary | ICD-10-CM

## 2017-12-13 DIAGNOSIS — R000 Tachycardia, unspecified: Secondary | ICD-10-CM | POA: Diagnosis not present

## 2017-12-13 DIAGNOSIS — Z0000 Encounter for general adult medical examination without abnormal findings: Secondary | ICD-10-CM

## 2017-12-13 DIAGNOSIS — E038 Other specified hypothyroidism: Secondary | ICD-10-CM

## 2017-12-13 DIAGNOSIS — I10 Essential (primary) hypertension: Secondary | ICD-10-CM | POA: Diagnosis not present

## 2017-12-13 DIAGNOSIS — E039 Hypothyroidism, unspecified: Secondary | ICD-10-CM

## 2017-12-13 LAB — POCT URINALYSIS DIPSTICK
Bilirubin, UA: NEGATIVE
Blood, UA: NEGATIVE
Glucose, UA: NEGATIVE
Ketones, UA: NEGATIVE
Leukocytes, UA: NEGATIVE
NITRITE UA: NEGATIVE
PH UA: 6 (ref 5.0–8.0)
PROTEIN UA: NEGATIVE
SPEC GRAV UA: 1.02 (ref 1.010–1.025)
UROBILINOGEN UA: 0.2 U/dL

## 2017-12-13 LAB — COMPREHENSIVE METABOLIC PANEL
ALBUMIN: 4.2 g/dL (ref 3.5–5.2)
ALK PHOS: 107 U/L (ref 39–117)
ALT: 20 U/L (ref 0–35)
AST: 18 U/L (ref 0–37)
BILIRUBIN TOTAL: 0.5 mg/dL (ref 0.2–1.2)
BUN: 10 mg/dL (ref 6–23)
CO2: 26 mEq/L (ref 19–32)
Calcium: 9.3 mg/dL (ref 8.4–10.5)
Chloride: 104 mEq/L (ref 96–112)
Creatinine, Ser: 0.83 mg/dL (ref 0.40–1.20)
GFR: 77.87 mL/min (ref >60.00)
GLUCOSE: 98 mg/dL (ref 70–99)
Potassium: 4.4 mEq/L (ref 3.5–5.1)
SODIUM: 139 meq/L (ref 135–145)
TOTAL PROTEIN: 6.9 g/dL (ref 6.0–8.3)

## 2017-12-13 LAB — CBC WITH DIFFERENTIAL/PLATELET
BASOS ABS: 0.1 10*3/uL (ref 0.0–0.1)
Basophils Relative: 1.1 % (ref 0.0–3.0)
EOS PCT: 3.9 % (ref 0.0–5.0)
Eosinophils Absolute: 0.3 10*3/uL (ref 0.0–0.7)
HCT: 39.9 % (ref 36.0–46.0)
HEMOGLOBIN: 13.2 g/dL (ref 12.0–15.0)
Lymphocytes Relative: 28.6 % (ref 12.0–46.0)
Lymphs Abs: 2.3 10*3/uL (ref 0.7–4.0)
MCHC: 33.2 g/dL (ref 30.0–36.0)
MCV: 102.2 fl — AB (ref 78.0–100.0)
MONOS PCT: 7 % (ref 3.0–12.0)
Monocytes Absolute: 0.6 10*3/uL (ref 0.1–1.0)
Neutro Abs: 4.8 10*3/uL (ref 1.4–7.7)
Neutrophils Relative %: 59.4 % (ref 43.0–77.0)
Platelets: 190 10*3/uL (ref 150.0–400.0)
RBC: 3.9 Mil/uL (ref 3.87–5.11)
RDW: 13.3 % (ref 11.5–15.5)
WBC: 8 10*3/uL (ref 4.0–10.5)

## 2017-12-13 LAB — LIPID PANEL
CHOLESTEROL: 173 mg/dL (ref 0–200)
HDL: 88.7 mg/dL (ref >39.00)
LDL Cholesterol: 50 mg/dL (ref 0–99)
NONHDL: 84.76
Total CHOL/HDL Ratio: 2
Triglycerides: 176 mg/dL — ABNORMAL HIGH (ref 0.0–149.0)
VLDL: 35.2 mg/dL (ref 0.0–40.0)

## 2017-12-13 LAB — T4, FREE: FREE T4: 0.83 ng/dL (ref 0.60–1.60)

## 2017-12-13 LAB — TSH: TSH: 7.31 u[IU]/mL — AB (ref 0.35–4.50)

## 2017-12-13 NOTE — Patient Instructions (Addendum)
Please return in 6 months for follow up on your blood pressure.  I will release your lab results to you on your MyChart account with further instructions. Please reply with any questions.   If you have any questions or concerns, please don't hesitate to send me a message via MyChart or call the office at [phone]. Thank you for visiting with [place] today! It's our pleasure caring for you.  Please do these things to maintain good health!   Exercise at least 30-45 minutes a day,  4-5 days a week.   Eat a low-fat diet with lots of fruits and vegetables, up to 7-9 servings per day.  Drink plenty of water daily. Try to drink 8 8oz glasses per day.  Seatbelts can save your life. Always wear your seatbelt.  Place Smoke Detectors on every level of your home and check batteries every year.  Schedule an appointment with an eye doctor for an eye exam every 1-2 years  Safe sex - use condoms to protect yourself from STDs if you could be exposed to these types of infections. Use birth control if you do not want to become pregnant and are sexually active.  Avoid heavy alcohol use. If you drink, keep it to less than 2 drinks/day and not every day.  [place].  Choose someone you trust that could speak for you if you became unable to speak for yourself.  Depression is common in our stressful world.If you're feeling down or losing interest in things you normally enjoy, please come in for a visit.  If anyone is threatening or hurting you, please get help. Physical or Emotional Violence is never OK.

## 2017-12-13 NOTE — Progress Notes (Signed)
Subjective  Chief Complaint  Patient presents with  . Annual Exam    no complaints, doing well. Patient is fasting     HPI: Evelyn Chase is a 47 y.o. female who presents to [place] at [facility] today for a Female Wellness Visit. She also has the concerns and/or needs as listed above in the chief complaint. These will be addressed in addition to the Health Maintenance Visit.   Wellness Visit: annual visit with health maintenance review and exam without Pap   HM: up to date.   Obesity: down 10 lbs in last working with nutritionist. Feeling better. Resting better. Healthier diet, much more veggies and lean proteins.   Chronic disease f/u and/or acute problem visit: (deemed necessary to be done in addition to the wellness visit):  Hypertension f/u: Control is good . Pt reports she is doing well. taking medications as instructed, no medication side effects noted, no TIAs, no chest pain on exertion, no dyspnea on exertion, no swelling of ankles. bp is lowering with weight loss as well. Would like to decrease meds at some point if continues. She denies adverse effects from his BP medications. Compliance with medication is good.   Inappropriate sinus tach remains well controlled w/o sxs on bb  Subclinical hypothyroidism: due for recheck. Energy levels are good. No sxs of low thyroid.   Allergies/asthma: well controlled.  Foot pain: extensor tendonitis: reviewed nl xray report. Doing much better. Minimal pain now. Swelling is resolved.   BP Readings from Last 3 Encounters:  12/13/17 118/78  12/04/17 118/78  11/14/17 132/80   Wt Readings from Last 3 Encounters:  12/13/17 175 lb 12.8 oz (79.7 kg)  12/04/17 180 lb (81.6 kg)  11/14/17 181 lb 6.4 oz (82.3 kg)   Lab Results  Component Value Date   CREATININE 0.84 07/18/2016   BUN 9 07/18/2016   NA 137 07/18/2016   K 4.2 07/18/2016   CL 104 07/18/2016   CO2 22 07/18/2016    Assessment  1. Annual  physical exam   2. Extensor tendonitis of foot   3. Essential hypertension   4. Subclinical hypothyroidism   5. Inappropriate sinus tachycardia   6. Obesity (BMI 30-39.9)      Plan  Female Wellness Visit:  Age appropriate Health Maintenance and Prevention measures were discussed with patient. Included topics are cancer screening recommendations, ways to keep healthy (see AVS) including dietary and exercise recommendations, regular eye and dental care, use of seat belts, and avoidance of moderate alcohol use and tobacco use. Screens are up to date: has 6 month f/u tomorrow for abnl mammo on left. Will await results. Nl breast exam today.   BMI: discussed patient's BMI and encouraged positive lifestyle modifications to help get to or maintain a target BMI. Doing great with healthy diet and weight loss.   HM needs and immunizations were addressed and ordered. See below for orders. See HM and immunization section for updates.  Routine labs and screening tests ordered including cmp, cbc and lipids where appropriate.  Discussed recommendations regarding Vit D and calcium supplementation (see AVS)  Chronic disease management visit and/or acute problem visit:  Hypertension follow up: excellent control today! Will continue same meds and recheck in 6 months. May be able to decrease antihypertensives. Check renal function and electrolytes and UA today.  Hyperlipidemia f/u: fasting lipids today  Ashtma/allergies are well controlled.   Extensor tendonitis: much improved, almost completely resolved. Ice if becomes sore again, firm soled shoes  with walking.  Recheck thyroid panel.   Tachy syndrome is stable.  Follow up: Return in about 6 months (around 06/14/2018) for follow up Hypertension.  Orders Placed This Encounter  Procedures  . CBC with Differential/Platelet  . Comprehensive metabolic panel  . Lipid panel  . HIV antibody  . TSH  . T3  . T4, free  . POCT urinalysis dipstick   No  orders of the defined types were placed in this encounter.     Lifestyle: Body mass index is 31.14 kg/m. Wt Readings from Last 3 Encounters:  12/13/17 175 lb 12.8 oz (79.7 kg)  12/04/17 180 lb (81.6 kg)  11/14/17 181 lb 6.4 oz (82.3 kg)   Diet: low fat Exercise: intermittently,   Patient Active Problem List   Diagnosis Date Noted  . Adenomatous polyp 11/06/2017    Priority: High    Dr. Watt Climes, q 3 year colonoscopy surveillance   . Essential hypertension 02/28/2012    Priority: High  . Inappropriate sinus tachycardia 11/06/2017    Priority: Medium    Evaluated January 2019 by cards, Dr. Carlis Abbott, [facility]; nl echocardiogam. Started ivabradine to slow heart rate in addition to BB.   . Oral contraceptive use 11/06/2017    Priority: Medium  . Subclinical hypothyroidism 11/06/2017    Priority: Medium  . Chronic diarrhea 11/06/2017    Priority: Medium    ? Malabsorption. Requesting records from GI.   Marland Kitchen Obesity (BMI 30-39.9) 11/06/2017    Priority: Medium    Seeing a nutritionist 2019   . Cough variant asthma 07/06/2007    Priority: Medium    Allergies are trigger   . 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 Seasonal and perennial allergic rhinitis 07/06/2007    Priority: Low    Allergy vaccine 1:50,000 07/25/2007, 1:10 06/16/2008, [facility]     Health Maintenance  Topic Date Due  . HIV Screening  08/06/1984  . INFLUENZA VACCINE  01/25/2018  . MAMMOGRAM  06/27/2018  . PAP SMEAR  02/24/2020  . TETANUS/TDAP  11/09/2022   Immunization History  Administered Date(s) Administered  . Influenza Split 03/27/2012, 03/27/2013, 04/06/2014, 03/28/2015  . Influenza, Seasonal, Injecte, Preservative Fre 03/28/2015, 03/31/2016  . Pneumococcal Polysaccharide-23 02/24/2015, 04/28/2015  . Tdap 11/08/2012   We updated and reviewed the patient's past history in detail and it is documented  below. Allergies: Patient is allergic to codeine. Past Medical History Patient  has a past medical history of Adenomatous polyp (11/06/2017), Allergic rhinitis, Asthma, Chronic diarrhea (11/06/2017), Chronic sinusitis with recurrent bronchitis, Hypertension, Obesity (BMI 30-39.9) (11/06/2017), Pneumonia, and Subclinical hypothyroidism (11/06/2017). Past Surgical History Patient  has a past surgical history that includes Breast enhancement surgery; Knee surgery; Tendon release; Shoulder surgery; Cholecystectomy (N/A, 07/22/2016); Finger surgery; and bilateral tempromandibular joint arthroplasty. Family History: Patient family history includes Arthritis in her father; Asthma in her father; COPD in her father; Dementia in her mother; Diabetes in her father; Emphysema in her father; Heart attack in her father; Heart disease in her father; Hyperlipidemia in her father; Hypertension in her father; Lung cancer (age of onset: 50) in her mother; Tuberculosis in her father. Social History:  Patient  reports that she has never smoked. She has never used smokeless tobacco. She reports that she drinks alcohol. She reports that she does not use drugs.  Review of Systems: Constitutional: negative for fever or malaise Ophthalmic: negative for photophobia, double vision  or loss of vision Cardiovascular: negative for chest pain, dyspnea on exertion, or new LE swelling Respiratory: negative for SOB or persistent cough Gastrointestinal: negative for abdominal pain, change in bowel habits or melena Genitourinary: negative for dysuria or gross hematuria, no abnormal uterine bleeding or disharge Musculoskeletal: negative for new gait disturbance or muscular weakness Integumentary: negative for new or persistent rashes, no breast lumps Neurological: negative for TIA or stroke symptoms Psychiatric: negative for SI or delusions Allergic/Immunologic: negative for hives  Patient Care Team    Relationship Specialty  Notifications Start End  Leamon Arnt, MD PCP - General Family Medicine  11/06/17   Clarene Essex, MD Consulting Physician Gastroenterology  11/06/17   Lorenda Cahill, DMD  Dentistry  11/06/17     Objective  Vitals: BP 118/78   Pulse 80   Temp 98 F (36.7 C)   Ht 5\' 3"  (1.6 m)   Wt 175 lb 12.8 oz (79.7 kg)   LMP 11/30/2017   SpO2 98%   BMI 31.14 kg/m  General:  Well developed, well nourished, no acute distress  Psych:  Alert and orientedx3,normal mood and affect HEENT:  Normocephalic, atraumatic, non-icteric sclera, PERRL, oropharynx is clear without mass or exudate, supple neck without adenopathy, mass or thyromegaly Cardiovascular:  Normal S1, S2, RRR without gallop, rub or murmur, nondisplaced PMI Respiratory:  Good breath sounds bilaterally, CTAB with normal respiratory effort Gastrointestinal: normal bowel sounds, soft, non-tender, no noted masses. No HSM MSK: no deformities, contusions. Joints are without erythema or swelling. Spine and CVA region are nontender Skin:  Warm, no rashes or suspicious lesions noted Neurologic:    Mental status is normal. CN 2-11 are normal. Gross motor and sensory exams are normal. Normal gait. No tremor Breast Exam: bilateral breast implants palpable.No mass, skin retraction or nipple discharge is appreciated in either breast. No axillary adenopathy. Fibrocystic changes are not noted   Commons side effects, risks, benefits, and alternatives for medications and treatment plan prescribed today were discussed, and the patient expressed understanding of the given instructions. Patient is instructed to call or message via MyChart if he/she has any questions or concerns regarding our treatment plan. No barriers to understanding were identified. We discussed Red Flag symptoms and signs in detail. Patient expressed understanding regarding what to do in case of urgent or emergency type symptoms.   Medication list was reconciled, printed and provided to the  patient in AVS. Patient instructions and summary information was reviewed with the patient as documented in the AVS. This note was prepared with assistance of Dragon voice recognition software. Occasional wrong-word or sound-a-like substitutions may have occurred due to the inherent limitations of voice recognition software

## 2017-12-14 DIAGNOSIS — R921 Mammographic calcification found on diagnostic imaging of breast: Secondary | ICD-10-CM | POA: Diagnosis not present

## 2017-12-14 LAB — T3: T3 TOTAL: 141 ng/dL (ref 76–181)

## 2017-12-14 LAB — HIV ANTIBODY (ROUTINE TESTING W REFLEX): HIV: NONREACTIVE

## 2017-12-14 LAB — HM MAMMOGRAPHY

## 2017-12-25 ENCOUNTER — Encounter: Payer: Self-pay | Admitting: Family Medicine

## 2018-01-04 DIAGNOSIS — Z713 Dietary counseling and surveillance: Secondary | ICD-10-CM | POA: Diagnosis not present

## 2018-01-04 DIAGNOSIS — Z6830 Body mass index (BMI) 30.0-30.9, adult: Secondary | ICD-10-CM | POA: Diagnosis not present

## 2018-02-01 DIAGNOSIS — Z713 Dietary counseling and surveillance: Secondary | ICD-10-CM | POA: Diagnosis not present

## 2018-02-01 DIAGNOSIS — Z6830 Body mass index (BMI) 30.0-30.9, adult: Secondary | ICD-10-CM | POA: Diagnosis not present

## 2018-03-01 DIAGNOSIS — Z6830 Body mass index (BMI) 30.0-30.9, adult: Secondary | ICD-10-CM | POA: Diagnosis not present

## 2018-03-01 DIAGNOSIS — Z713 Dietary counseling and surveillance: Secondary | ICD-10-CM | POA: Diagnosis not present

## 2018-03-08 ENCOUNTER — Ambulatory Visit: Payer: 59 | Admitting: Family Medicine

## 2018-03-08 ENCOUNTER — Other Ambulatory Visit: Payer: Self-pay

## 2018-03-08 ENCOUNTER — Encounter: Payer: Self-pay | Admitting: Family Medicine

## 2018-03-08 VITALS — BP 120/78 | HR 82 | Temp 98.1°F | Ht 63.0 in | Wt 172.6 lb

## 2018-03-08 DIAGNOSIS — L309 Dermatitis, unspecified: Secondary | ICD-10-CM

## 2018-03-08 DIAGNOSIS — Z23 Encounter for immunization: Secondary | ICD-10-CM | POA: Diagnosis not present

## 2018-03-08 MED ORDER — TRIAMCINOLONE ACETONIDE 0.1 % EX CREA: 1.0000 "application " | TOPICAL_CREAM | Freq: Two times a day (BID) | CUTANEOUS | 0 refills | Status: DC

## 2018-03-08 MED ORDER — CEPHALEXIN 500 MG PO CAPS: 500.0000 mg | ORAL_CAPSULE | Freq: Two times a day (BID) | ORAL | 0 refills | Status: DC

## 2018-03-08 NOTE — Patient Instructions (Signed)
Please follow up if symptoms do not improve or as needed.   Use the cream twice daily.  IF you get blisters or oozing, take the oral antibiotics.

## 2018-03-08 NOTE — Progress Notes (Signed)
Subjective  CC:  Chief Complaint  Patient presents with  . Rash    red, with itching on bottom of Left Ankle     HPI: Evelyn Chase is a 47 y.o. female who presents to the office today to address the problems listed above in the chief complaint.  Complains of small dry itching rash on the back of her left lower leg.  Started about 2 months ago, has worsened to 2 patches.  Has used topical moisturizers without relief.  Also describes mild oozing intermittently over the last 2 months but none now.  No systemic symptoms.  No known exposures or contact irritants.  Otherwise feels well no history of eczema Assessment  1. Eczema, unspecified type   2. Need for influenza vaccination      Plan   Rash: Eczematous rash could possibly be chronic impetigo as well.  Education given.  Start with steroid cream twice a day for 1 to 2 weeks.  If becomes red or oozing again, take oral antibiotics.  Follow-up as needed.  Influenza vaccination given today.  Follow up: Return if symptoms worsen or fail to improve.   Orders Placed This Encounter  Procedures  . Flu Vaccine QUAD 36+ mos IM   Meds ordered this encounter  Medications  . triamcinolone cream (KENALOG) 0.1 %    Sig: Apply 1 application topically 2 (two) times daily. For 2 weeks, then as needed    Dispense:  30 g    Refill:  0  . cephALEXin (KEFLEX) 500 MG capsule    Sig: Take 1 capsule (500 mg total) by mouth 2 (two) times daily.    Dispense:  14 capsule    Refill:  0      I reviewed the patients updated PMH, FH, and SocHx.    Patient Active Problem List   Diagnosis Date Noted  . Adenomatous polyp 11/06/2017    Priority: High  . Essential hypertension 02/28/2012    Priority: High  . Inappropriate sinus tachycardia 11/06/2017    Priority: Medium  . Oral contraceptive use 11/06/2017    Priority: Medium  . Subclinical hypothyroidism 11/06/2017    Priority: Medium  . Chronic diarrhea 11/06/2017    Priority: Medium    . Obesity (BMI 30-39.9) 11/06/2017    Priority: Medium  . Cough variant asthma 07/06/2007    Priority: Medium  . Bilateral low frequency hearing loss 11/06/2017    Priority: Low  . Seasonal and perennial allergic rhinitis 07/06/2007    Priority: Low   Current Meds  Medication Sig  . albuterol (PROAIR HFA) 108 (90 BASE) MCG/ACT inhaler Inhale 2 puffs into the lungs 4 (four) times daily as needed.  Marland Kitchen amLODipine (NORVASC) 5 MG tablet Take by mouth.  . Ascorbic Acid (VITAMIN C) 500 MG CAPS Take 1 tablet by mouth 2 (two) times daily.  Marland Kitchen azelastine (ASTELIN) 0.1 % nasal spray Place into the nose.  . budesonide-formoterol (SYMBICORT) 160-4.5 MCG/ACT inhaler Inhale into the lungs.  . calcium carbonate (OS-CAL) 600 MG TABS tablet Take 600 mg by mouth daily.  . cetirizine (ZYRTEC) 5 MG tablet Take by mouth.  . desogestrel-ethinyl estradiol (APRI,EMOQUETTE,SOLIA) 0.15-30 MG-MCG tablet Take 1 tablet by mouth daily.  . diclofenac (VOLTAREN) 75 MG EC tablet Take 1 tablet (75 mg total) by mouth 2 (two) times daily.  Marland Kitchen EPINEPHrine (EPI-PEN) 0.3 mg/0.3 mL SOAJ injection Inject into thigh for severe allergic reaction  . ivabradine (CORLANOR) 5 MG TABS tablet Take 5 mg by  mouth 2 (two) times daily with a meal.  . losartan (COZAAR) 100 MG tablet TAKE 1 TABLET BY MOUTH  DAILY  . metoprolol (TOPROL-XL) 200 MG 24 hr tablet Take 200 mg by mouth daily.   . Multiple Vitamin (MULTIVITAMIN) tablet Take 1 tablet by mouth daily.  . ranitidine (ZANTAC) 300 MG tablet TAKE 1 TABLET AT BEDTIME    Allergies: Patient is allergic to codeine. Family History: Patient family history includes Arthritis in her father; Asthma in her father; COPD in her father; Dementia in her mother; Diabetes in her father; Emphysema in her father; Heart attack in her father; Heart disease in her father; Hyperlipidemia in her father; Hypertension in her father; Lung cancer (age of onset: 43) in her mother; Tuberculosis in her father. Social  History:  Patient  reports that she has never smoked. She has never used smokeless tobacco. She reports that she drinks alcohol. She reports that she does not use drugs.  Review of Systems: Constitutional: Negative for fever malaise or anorexia Cardiovascular: negative for chest pain Respiratory: negative for SOB or persistent cough Gastrointestinal: negative for abdominal pain  Objective  Vitals: BP 120/78   Pulse 82   Temp 98.1 F (36.7 C)   Ht 5\' 3"  (1.6 m)   Wt 172 lb 9.6 oz (78.3 kg)   BMI 30.57 kg/m  General: no acute distress , A&Ox3 Skin:  Warm, posterior lower left extremities with 2 patches of dry skin rash minimal flaking, no blisters oozing redness warmth or hives.    Commons side effects, risks, benefits, and alternatives for medications and treatment plan prescribed today were discussed, and the patient expressed understanding of the given instructions. Patient is instructed to call or message via MyChart if he/she has any questions or concerns regarding our treatment plan. No barriers to understanding were identified. We discussed Red Flag symptoms and signs in detail. Patient expressed understanding regarding what to do in case of urgent or emergency type symptoms.   Medication list was reconciled, printed and provided to the patient in AVS. Patient instructions and summary information was reviewed with the patient as documented in the AVS. This note was prepared with assistance of Dragon voice recognition software. Occasional wrong-word or sound-a-like substitutions may have occurred due to the inherent limitations of voice recognition software

## 2018-03-13 ENCOUNTER — Encounter: Payer: Self-pay | Admitting: Family Medicine

## 2018-03-14 MED ORDER — METOPROLOL SUCCINATE ER 200 MG PO TB24: 200.0000 mg | ORAL_TABLET | Freq: Every day | ORAL | 3 refills | Status: DC

## 2018-03-14 MED ORDER — RANITIDINE HCL 300 MG PO TABS: 300.0000 mg | ORAL_TABLET | Freq: Two times a day (BID) | ORAL | 3 refills | Status: DC

## 2018-03-14 MED ORDER — LOSARTAN POTASSIUM 100 MG PO TABS: 100.0000 mg | ORAL_TABLET | Freq: Every day | ORAL | 3 refills | Status: DC

## 2018-03-14 MED ORDER — IVABRADINE HCL 5 MG PO TABS: 5.0000 mg | ORAL_TABLET | Freq: Two times a day (BID) | ORAL | 3 refills | Status: DC

## 2018-03-14 MED ORDER — AMLODIPINE BESYLATE 5 MG PO TABS: 5.0000 mg | ORAL_TABLET | Freq: Every day | ORAL | 3 refills | Status: DC

## 2018-05-03 ENCOUNTER — Other Ambulatory Visit: Payer: Self-pay

## 2018-05-03 ENCOUNTER — Encounter: Payer: Self-pay | Admitting: Family Medicine

## 2018-05-03 ENCOUNTER — Ambulatory Visit: Payer: 59 | Admitting: Family Medicine

## 2018-05-03 VITALS — BP 112/72 | Temp 98.8°F | Ht 63.0 in | Wt 169.0 lb

## 2018-05-03 DIAGNOSIS — J320 Chronic maxillary sinusitis: Secondary | ICD-10-CM | POA: Diagnosis not present

## 2018-05-03 DIAGNOSIS — J45991 Cough variant asthma: Secondary | ICD-10-CM

## 2018-05-03 LAB — POCT RAPID STREP A (OFFICE): Rapid Strep A Screen: NEGATIVE

## 2018-05-03 MED ORDER — AMOXICILLIN-POT CLAVULANATE 875-125 MG PO TABS: 1.0000 | ORAL_TABLET | Freq: Two times a day (BID) | ORAL | 0 refills | Status: DC

## 2018-05-03 MED ORDER — ALBUTEROL SULFATE HFA 108 (90 BASE) MCG/ACT IN AERS: 2.0000 | INHALATION_SPRAY | Freq: Four times a day (QID) | RESPIRATORY_TRACT | 2 refills | Status: DC | PRN

## 2018-05-03 NOTE — Progress Notes (Signed)
Subjective   CC:  Chief Complaint  Patient presents with  . Sore Throat    x 4 days, cough and sneezing, chills, sinus pressure     HPI: Evelyn Chase is a 47 y.o. female who presents to the office today to address the problems listed above in the chief complaint.  Patient reports sore throat, sinus congestion and pressure on left with thick drainage, mild nonproductive cough, ear pressure without pain, and mild malaise.  Symptoms have been present for several days.Shedenies high fevers, GI symptoms, shortness of breath. Shehas had sinus infections in the past and this feels similar.  Patient is a non-smoker. + cough variant asthma. Hasn't used inhaler. Denies sob.  I reviewed the patients updated PMH, FH, and SocHx.    Patient Active Problem List   Diagnosis Date Noted  . Adenomatous polyp 11/06/2017    Priority: High  . Essential hypertension 02/28/2012    Priority: High  . Inappropriate sinus tachycardia 11/06/2017    Priority: Medium  . Oral contraceptive use 11/06/2017    Priority: Medium  . Subclinical hypothyroidism 11/06/2017    Priority: Medium  . Chronic diarrhea 11/06/2017    Priority: Medium  . Obesity (BMI 30-39.9) 11/06/2017    Priority: Medium  . Cough variant asthma 07/06/2007    Priority: Medium  . Bilateral low frequency hearing loss 11/06/2017    Priority: Low  . Seasonal and perennial allergic rhinitis 07/06/2007    Priority: Low   Current Meds  Medication Sig  . albuterol (PROAIR HFA) 108 (90 Base) MCG/ACT inhaler Inhale 2 puffs into the lungs 4 (four) times daily as needed.  Marland Kitchen amLODipine (NORVASC) 5 MG tablet Take 1 tablet (5 mg total) by mouth daily.  . Ascorbic Acid (VITAMIN C) 500 MG CAPS Take 1 tablet by mouth 2 (two) times daily.  Marland Kitchen azelastine (ASTELIN) 0.1 % nasal spray Place into the nose.  . budesonide-formoterol (SYMBICORT) 160-4.5 MCG/ACT inhaler Inhale into the lungs.  . calcium carbonate (OS-CAL) 600 MG TABS tablet Take 600 mg  by mouth daily.  . cetirizine (ZYRTEC) 5 MG tablet Take by mouth.  . desogestrel-ethinyl estradiol (APRI,EMOQUETTE,SOLIA) 0.15-30 MG-MCG tablet Take 1 tablet by mouth daily.  Marland Kitchen EPINEPHrine (EPI-PEN) 0.3 mg/0.3 mL SOAJ injection Inject into thigh for severe allergic reaction  . ivabradine (CORLANOR) 5 MG TABS tablet Take 1 tablet (5 mg total) by mouth 2 (two) times daily with a meal.  . losartan (COZAAR) 100 MG tablet Take 1 tablet (100 mg total) by mouth daily.  . metoprolol (TOPROL-XL) 200 MG 24 hr tablet Take 1 tablet (200 mg total) by mouth daily.  . Multiple Vitamin (MULTIVITAMIN) tablet Take 1 tablet by mouth daily.  . ranitidine (ZANTAC) 300 MG tablet Take 1 tablet (300 mg total) by mouth 2 (two) times daily. TAKE 1 TABLET AT BEDTIME  . triamcinolone cream (KENALOG) 0.1 % Apply 1 application topically 2 (two) times daily. For 2 weeks, then as needed  . [DISCONTINUED] albuterol (PROAIR HFA) 108 (90 BASE) MCG/ACT inhaler Inhale 2 puffs into the lungs 4 (four) times daily as needed.    Review of Systems: Cardiovascular: negative for chest pain Respiratory: negative for SOB or persistent cough Gastrointestinal: negative for abdominal pain Genitourinary: negative for dysuria or gross hematuria  Objective  Vitals: BP 112/72   Temp 98.8 F (37.1 C)   Ht 5\' 3"  (1.6 m)   Wt 169 lb (76.7 kg)   SpO2 99%   BMI 29.94 kg/m  General:  no acute distress  Psych:  Alert and oriented, normal mood and affect HEENT:  Normocephalic, atraumatic, TMs with serous effusions or retraction w/o erythema, nasal mucosa is red with purulent drainage, tender maxillary sinus present on left, OP mild erythematous w/o eudate, supple neck without LAD Cardiovascular:  RRR without murmur or gallop. no peripheral edema Respiratory:  Good breath sounds bilaterally, CTAB with normal respiratory effort Skin:  Warm, no rashes Neurologic:   Mental status is normal. normal gait  Office Visit on 05/03/2018  Component  Date Value Ref Range Status  . Rapid Strep A Screen 05/03/2018 Negative  Negative Final    Assessment  1. Left maxillary sinusitis   2. Cough variant asthma      Plan    Sinusitis: History and exam is most consistent with bacterial sinus infection.  Etiology and prognosis discussed with patient.  Recommend antibiotics as ordered below.  Patient to complete course of antibiotics, use supportive medications like mucolytics and decongestants as needed.  May use Tylenol or Advil if needed.  Symptoms should improve over the next 2 weeks.  Patient will return or call if symptoms persist or worsen  Cough variant asthma - mildly active. Start albuterol and monitor. If worsens, call and i'll add a steroid.  Follow up: Return if symptoms worsen or fail to improve.    Commons side effects, risks, benefits, and alternatives for medications and treatment plan prescribed today were discussed, and the patient expressed understanding of the given instructions. Patient is instructed to call or message via MyChart if he/she has any questions or concerns regarding our treatment plan. No barriers to understanding were identified. We discussed Red Flag symptoms and signs in detail. Patient expressed understanding regarding what to do in case of urgent or emergency type symptoms.   Medication list was reconciled, printed and provided to the patient in AVS. Patient instructions and summary information was reviewed with the patient as documented in the AVS. This note was prepared with assistance of Dragon voice recognition software. Occasional wrong-word or sound-a-like substitutions may have occurred due to the inherent limitations of voice recognition software  Orders Placed This Encounter  Procedures  . POCT rapid strep A   Meds ordered this encounter  Medications  . amoxicillin-clavulanate (AUGMENTIN) 875-125 MG tablet    Sig: Take 1 tablet by mouth 2 (two) times daily.    Dispense:  14 tablet     Refill:  0  . albuterol (PROAIR HFA) 108 (90 Base) MCG/ACT inhaler    Sig: Inhale 2 puffs into the lungs 4 (four) times daily as needed.    Dispense:  1 Inhaler    Refill:  2

## 2018-05-03 NOTE — Patient Instructions (Signed)
Please follow up if symptoms do not improve or as needed.   Take the antibiotics. You may use Delsym cough syrup or Mucinex DM to help with congestion and coughing. Use your inhaler to see if it will help your coughing spells.  Rest.  Let me know if your breathing gets worse.   If you have any questions or concerns, please don't hesitate to send me a message via MyChart or call the office at [phone]. Thank you for visiting with [place] today! It's our pleasure caring for you.

## 2018-05-04 ENCOUNTER — Encounter: Payer: Self-pay | Admitting: Family Medicine

## 2018-05-04 MED ORDER — PREDNISONE 20 MG PO TABS: ORAL_TABLET | ORAL | 0 refills | Status: DC

## 2018-06-11 ENCOUNTER — Encounter: Payer: Self-pay | Admitting: Family Medicine

## 2018-06-11 ENCOUNTER — Other Ambulatory Visit: Payer: Self-pay

## 2018-06-11 ENCOUNTER — Ambulatory Visit: Payer: 59 | Admitting: Family Medicine

## 2018-06-11 VITALS — BP 122/86 | HR 81 | Temp 98.1°F | Ht 63.0 in | Wt 175.8 lb

## 2018-06-11 DIAGNOSIS — E669 Obesity, unspecified: Secondary | ICD-10-CM | POA: Diagnosis not present

## 2018-06-11 DIAGNOSIS — J45991 Cough variant asthma: Secondary | ICD-10-CM

## 2018-06-11 DIAGNOSIS — J302 Other seasonal allergic rhinitis: Secondary | ICD-10-CM | POA: Diagnosis not present

## 2018-06-11 DIAGNOSIS — I10 Essential (primary) hypertension: Secondary | ICD-10-CM

## 2018-06-11 DIAGNOSIS — K219 Gastro-esophageal reflux disease without esophagitis: Secondary | ICD-10-CM | POA: Diagnosis not present

## 2018-06-11 DIAGNOSIS — J3089 Other allergic rhinitis: Secondary | ICD-10-CM

## 2018-06-11 DIAGNOSIS — K529 Noninfective gastroenteritis and colitis, unspecified: Secondary | ICD-10-CM | POA: Diagnosis not present

## 2018-06-11 MED ORDER — OMEPRAZOLE 20 MG PO CPDR: 20.0000 mg | DELAYED_RELEASE_CAPSULE | Freq: Every day | ORAL | 3 refills | Status: DC

## 2018-06-11 MED ORDER — LOSARTAN POTASSIUM-HCTZ 100-25 MG PO TABS: 1.0000 | ORAL_TABLET | Freq: Every day | ORAL | 3 refills | Status: DC

## 2018-06-11 NOTE — Progress Notes (Signed)
Subjective  CC:  Chief Complaint  Patient presents with  . Hypertension    doing well, no complaints  . Gastroesophageal Reflux    hoarse all the time, she wonders if medications are not working as they should     HPI: Evelyn Chase is a 47 y.o. female who presents to the office today to address the problems listed above in the chief complaint.  Hypertension f/u: Control is good . Pt reports she is doing well. taking medications as instructed, no medication side effects noted, no TIAs, no chest pain on exertion, no dyspnea on exertion. However, since addition of amlodipine, she has swelling of bilateral ankles.  She finds this bothersome. She denies adverse effects from his BP medications. Compliance with medication is good.  She is not currently on a diuretic.  GERD: She has been on chronic Zantac for several years.  However over the last several weeks she has had worsening indigestion, and epigastric pain and a few episodes of bilious vomiting.  No hematemesis.  No melena.  No unwanted weight loss.  In fact she has put on a few pounds.  This could be making her GERD worse.  Chronic allergies and cough variant asthma: Allergies are active with postnasal drainage and hoarseness.  She is on multiple allergy medications as listed below.  No fevers or productive cough.  No wheezing.  Chronic diarrhea with negative GI evaluation in the past.  This is mildly flared recently.  No fevers no abdominal pain.  Assessment  1. Essential hypertension   2. Seasonal and perennial allergic rhinitis   3. Chronic diarrhea   4. Cough variant asthma   5. GERD without esophagitis   6. Obesity (BMI 30-39.9)      Plan    Hypertension f/u: BP control is fairly well controlled.  However, change medications due to adverse effect of lower extremity edema.  Hold amlodipine, start HCTZ in combination with losartan.  Patient to monitor blood pressures at home, if elevated will reinstitute amlodipine.   She will follow-up here if she has any problems.  GERD f/u: Active flare of GERD plus minus gastritis: Step up therapy to Prilosec.  Monitor for worsening diarrhea.  Recheck if progression of symptoms develops.  Chronic diarrhea: Mildly worsening.  Patient to self monitor.  Will reconsult GI if persist.  No red flag symptoms now.  Colonoscopy for colon cancer surveillance is up-to-date  Cough variant asthma: Stable.  Continue current medications  Chronic allergies: Mildly active due to season: Continue current allergy medicines.  Contributing to hoarseness.  Reassured Education regarding management of these chronic disease states was given. Management strategies discussed on successive visits include dietary and exercise recommendations, goals of achieving and maintaining IBW, and lifestyle modifications aiming for adequate sleep and minimizing stressors.   Follow up: Return in about 6 months (around 12/11/2018) for complete physical.  No orders of the defined types were placed in this encounter.  Meds ordered this encounter  Medications  . losartan-hydrochlorothiazide (HYZAAR) 100-25 MG tablet    Sig: Take 1 tablet by mouth daily.    Dispense:  90 tablet    Refill:  3  . omeprazole (PRILOSEC) 20 MG capsule    Sig: Take 1 capsule (20 mg total) by mouth daily.    Dispense:  30 capsule    Refill:  3      BP Readings from Last 3 Encounters:  06/11/18 122/86  05/03/18 112/72  03/08/18 120/78   Wt Readings from  Last 3 Encounters:  06/11/18 175 lb 12.8 oz (79.7 kg)  05/03/18 169 lb (76.7 kg)  03/08/18 172 lb 9.6 oz (78.3 kg)    Lab Results  Component Value Date   CHOL 173 12/13/2017   Lab Results  Component Value Date   HDL 88.70 12/13/2017   Lab Results  Component Value Date   LDLCALC 50 12/13/2017   Lab Results  Component Value Date   TRIG 176.0 (H) 12/13/2017   Lab Results  Component Value Date   CHOLHDL 2 12/13/2017   No results found for: LDLDIRECT Lab  Results  Component Value Date   CREATININE 0.83 12/13/2017   BUN 10 12/13/2017   NA 139 12/13/2017   K 4.4 12/13/2017   CL 104 12/13/2017   CO2 26 12/13/2017    The 10-year ASCVD risk score Mikey Bussing DC Jr., et al., 2013) is: 0.5%   Values used to calculate the score:     Age: 65 years     Sex: Female     Is Non-Hispanic African American: No     Diabetic: No     Tobacco smoker: No     Systolic Blood Pressure: 387 mmHg     Is BP treated: Yes     HDL Cholesterol: 88.7 mg/dL     Total Cholesterol: 173 mg/dL  I reviewed the patients updated PMH, FH, and SocHx.    Patient Active Problem List   Diagnosis Date Noted  . Adenomatous polyp 11/06/2017    Priority: High  . Essential hypertension 02/28/2012    Priority: High  . GERD without esophagitis 06/11/2018    Priority: Medium  . Inappropriate sinus tachycardia 11/06/2017    Priority: Medium  . Oral contraceptive use 11/06/2017    Priority: Medium  . Subclinical hypothyroidism 11/06/2017    Priority: Medium  . Chronic diarrhea 11/06/2017    Priority: Medium  . Obesity (BMI 30-39.9) 11/06/2017    Priority: Medium  . Cough variant asthma 07/06/2007    Priority: Medium  . Bilateral low frequency hearing loss 11/06/2017    Priority: Low  . Seasonal and perennial allergic rhinitis 07/06/2007    Priority: Low    Allergies: Codeine  Social History: Patient  reports that she has never smoked. She has never used smokeless tobacco. She reports current alcohol use. She reports that she does not use drugs.  Current Meds  Medication Sig  . albuterol (PROAIR HFA) 108 (90 Base) MCG/ACT inhaler Inhale 2 puffs into the lungs 4 (four) times daily as needed.  Marland Kitchen amLODipine (NORVASC) 5 MG tablet Take 1 tablet (5 mg total) by mouth daily.  . Ascorbic Acid (VITAMIN C) 500 MG CAPS Take 1 tablet by mouth 2 (two) times daily.  . calcium carbonate (OS-CAL) 600 MG TABS tablet Take 600 mg by mouth daily.  . cetirizine (ZYRTEC) 5 MG tablet Take  by mouth.  . desogestrel-ethinyl estradiol (APRI,EMOQUETTE,SOLIA) 0.15-30 MG-MCG tablet Take 1 tablet by mouth daily.  Marland Kitchen EPINEPHrine (EPI-PEN) 0.3 mg/0.3 mL SOAJ injection Inject into thigh for severe allergic reaction  . ivabradine (CORLANOR) 5 MG TABS tablet Take 1 tablet (5 mg total) by mouth 2 (two) times daily with a meal.  . metoprolol (TOPROL-XL) 200 MG 24 hr tablet Take 1 tablet (200 mg total) by mouth daily.  . Multiple Vitamin (MULTIVITAMIN) tablet Take 1 tablet by mouth daily.  Marland Kitchen triamcinolone cream (KENALOG) 0.1 % Apply 1 application topically 2 (two) times daily. For 2 weeks, then as needed  . [  DISCONTINUED] losartan (COZAAR) 100 MG tablet Take 1 tablet (100 mg total) by mouth daily.  . [DISCONTINUED] ranitidine (ZANTAC) 300 MG tablet Take 1 tablet (300 mg total) by mouth 2 (two) times daily. TAKE 1 TABLET AT BEDTIME (Patient taking differently: Take 300 mg by mouth at bedtime. TAKE 1 TABLET AT BEDTIME)    Review of Systems: Cardiovascular: negative for chest pain, palpitations, leg swelling, orthopnea Respiratory: negative for SOB, wheezing or persistent cough Gastrointestinal: negative for abdominal pain Genitourinary: negative for dysuria or gross hematuria  Objective  Vitals: BP 122/86   Pulse 81   Temp 98.1 F (36.7 C)   Ht 5\' 3"  (1.6 m)   Wt 175 lb 12.8 oz (79.7 kg)   SpO2 99%   BMI 31.14 kg/m  General: no acute distress  Psych:  Alert and oriented, normal mood and affect HEENT:  Normocephalic, atraumatic, supple neck, nasal congestion present, oropharynx clear Cardiovascular:  RRR without murmur.  Trace bilateral nonpitting edema Respiratory:  Good breath sounds bilaterally, CTAB with normal respiratory effort, no wheezing Skin:  Warm, no rashes Neurologic:   Mental status is normal  Commons side effects, risks, benefits, and alternatives for medications and treatment plan prescribed today were discussed, and the patient expressed understanding of the given  instructions. Patient is instructed to call or message via MyChart if he/she has any questions or concerns regarding our treatment plan. No barriers to understanding were identified. We discussed Red Flag symptoms and signs in detail. Patient expressed understanding regarding what to do in case of urgent or emergency type symptoms.   Medication list was reconciled, printed and provided to the patient in AVS. Patient instructions and summary information was reviewed with the patient as documented in the AVS. This note was prepared with assistance of Dragon voice recognition software. Occasional wrong-word or sound-a-like substitutions may have occurred due to the inherent limitations of voice recognition software

## 2018-06-11 NOTE — Patient Instructions (Signed)
   Please return in June 2020 for your annual complete physical; please come fasting.  Stop the amlodipine and take the new combo bp medication with a diuretic. IF your blood pressure readings get elevated, add back the amlodipine.   If you have any questions or concerns, please don't hesitate to send me a message via MyChart or call the office at [phone]. Thank you for visiting with [place] today! It's our pleasure caring for you.

## 2018-06-14 ENCOUNTER — Telehealth: Payer: Self-pay | Admitting: Family Medicine

## 2018-06-14 NOTE — Telephone Encounter (Signed)
LMOVM to return call. At last OV, Zantac was d/c'd and Omeprazole was started. Checking that pt is tolerating Omeprazole, if so 90 day supply can be sent to [facility].

## 2018-06-14 NOTE — Telephone Encounter (Signed)
Routing back to [employer]

## 2018-06-14 NOTE — Telephone Encounter (Signed)
Spoke with pt, she reports that her insurance company has called to verify which medication. She is currently taking Omeprazole and reports that symptoms are improving so far. She aware to callback if symptoms worsen.

## 2018-06-14 NOTE — Telephone Encounter (Signed)
Copied from [place] [id number]. Topic: Quick Communication - Rx Refill/Question >> Jun 14, 2018  9:56 AM Alanda Slim E wrote: Medication: ranitidine (ZANTAC) 300 MG tablet (this medication is showing as discontinued on Pt med list)   Has the patient contacted their pharmacy? Yes.  Pharmacy called for clarification for the dose instructions / please advise   Preferred Pharmacy (with phone number or street name): [place], [place] [phone] (Phone) [phone] (Fax)    Agent: Please be advised that RX refills may take up to 3 business days. We ask that you follow-up with your pharmacy.

## 2018-06-21 DIAGNOSIS — R921 Mammographic calcification found on diagnostic imaging of breast: Secondary | ICD-10-CM | POA: Diagnosis not present

## 2018-06-21 LAB — HM MAMMOGRAPHY

## 2018-06-22 ENCOUNTER — Encounter: Payer: Self-pay | Admitting: *Deleted

## 2018-09-10 ENCOUNTER — Other Ambulatory Visit: Payer: Self-pay | Admitting: *Deleted

## 2018-09-10 MED ORDER — DESOGESTREL-ETHINYL ESTRADIOL 0.15-30 MG-MCG PO TABS: 1.0000 | ORAL_TABLET | Freq: Every day | ORAL | 0 refills | Status: DC

## 2018-09-10 MED ORDER — LOSARTAN POTASSIUM-HCTZ 100-25 MG PO TABS: 1.0000 | ORAL_TABLET | Freq: Every day | ORAL | 0 refills | Status: DC

## 2018-09-10 MED ORDER — IVABRADINE HCL 5 MG PO TABS: 5.0000 mg | ORAL_TABLET | Freq: Two times a day (BID) | ORAL | 0 refills | Status: DC

## 2018-09-13 ENCOUNTER — Encounter: Payer: Self-pay | Admitting: Family Medicine

## 2018-09-18 MED ORDER — LOSARTAN POTASSIUM-HCTZ 100-25 MG PO TABS: 1.0000 | ORAL_TABLET | Freq: Every day | ORAL | 0 refills | Status: DC

## 2018-09-18 MED ORDER — OMEPRAZOLE 20 MG PO CPDR: 20.0000 mg | DELAYED_RELEASE_CAPSULE | Freq: Every day | ORAL | 0 refills | Status: DC

## 2018-09-21 MED ORDER — LOSARTAN POTASSIUM 100 MG PO TABS: 100.0000 mg | ORAL_TABLET | Freq: Every day | ORAL | 0 refills | Status: DC

## 2018-09-21 NOTE — Addendum Note (Signed)
Addended by: Layla Barter on: 09/21/2018 09:20 AM   Modules accepted: Orders

## 2018-09-24 ENCOUNTER — Encounter: Payer: Self-pay | Admitting: *Deleted

## 2018-10-19 ENCOUNTER — Encounter: Payer: Self-pay | Admitting: Family Medicine

## 2018-10-19 MED ORDER — METOPROLOL SUCCINATE ER 200 MG PO TB24: 200.0000 mg | ORAL_TABLET | Freq: Every day | ORAL | 1 refills | Status: DC

## 2018-11-23 ENCOUNTER — Other Ambulatory Visit: Payer: Self-pay | Admitting: Family Medicine

## 2018-11-23 ENCOUNTER — Encounter: Payer: Self-pay | Admitting: *Deleted

## 2018-12-06 ENCOUNTER — Other Ambulatory Visit: Payer: Self-pay | Admitting: Family Medicine

## 2018-12-17 ENCOUNTER — Ambulatory Visit: Payer: 59 | Admitting: Family Medicine

## 2018-12-19 ENCOUNTER — Encounter: Payer: Self-pay | Admitting: Family Medicine

## 2018-12-19 ENCOUNTER — Ambulatory Visit (INDEPENDENT_AMBULATORY_CARE_PROVIDER_SITE_OTHER): Payer: 59 | Admitting: Family Medicine

## 2018-12-19 ENCOUNTER — Other Ambulatory Visit: Payer: Self-pay

## 2018-12-19 VITALS — BP 130/76 | HR 89 | Temp 98.4°F | Resp 16 | Ht 63.0 in | Wt 171.8 lb

## 2018-12-19 DIAGNOSIS — L720 Epidermal cyst: Secondary | ICD-10-CM

## 2018-12-19 MED ORDER — DOXYCYCLINE HYCLATE 100 MG PO TABS: 100.0000 mg | ORAL_TABLET | Freq: Two times a day (BID) | ORAL | 0 refills | Status: AC

## 2018-12-19 NOTE — Patient Instructions (Signed)
Please return anytime for your annual complete physical; please come fasting. Your last was in June 2019.  Please complete the antibiotics.   We will call you with information regarding your referral appointment. General surgery cyst excision.  If you do not hear from [place] within the next 2 weeks, please let me know. It can take 1-2 weeks to get appointments set up with the specialists.    If you have any questions or concerns, please don't hesitate to send me a message via MyChart or call the office at [phone]. Thank you for visiting with [place] today! It's our pleasure caring for you.   Epidermal Cyst  An epidermal cyst is a sac made of skin tissue. The sac contains a substance called keratin. Keratin is a protein that is normally secreted through the hair follicles. When keratin becomes trapped in the top layer of skin (epidermis), it can form an epidermal cyst. Epidermal cysts can be found anywhere on your body. These cysts are usually harmless (benign), and they may not cause symptoms unless they become infected. What are the causes? This condition may be caused by:  A blocked hair follicle.  A hair that curls and re-enters the skin instead of growing straight out of the skin (ingrown hair).  A blocked pore.  Irritated skin.  An injury to the skin.  Certain conditions that are passed along from parent to child (inherited).  Human papillomavirus (HPV).  Long-term (chronic) sun damage to the skin. What increases the risk? The following factors may make you more likely to develop an epidermal cyst:  Having acne.  Being overweight.  Being 62-43 years old. What are the signs or symptoms? The only symptom of this condition may be a small, painless lump underneath the skin. When an epidermal cyst ruptures, it may become infected. Symptoms may include:  Redness.  Inflammation.  Tenderness.  Warmth.  Fever.  Keratin draining from the cyst. Keratin is grayish-white,  bad-smelling substance.  Pus draining from the cyst. How is this diagnosed? This condition is diagnosed with a physical exam.  In some cases, you may have a sample of tissue (biopsy) taken from your cyst to be examined under a microscope or tested for bacteria.  You may be referred to a health care provider who specializes in skin care (dermatologist). How is this treated? In many cases, epidermal cysts go away on their own without treatment. If a cyst becomes infected, treatment may include:  Opening and draining the cyst, done by a health care provider. After draining, minor surgery to remove the rest of the cyst may be done.  Antibiotic medicine.  Injections of medicines (steroids) that help to reduce inflammation.  Surgery to remove the cyst. Surgery may be done if the cyst: ? Becomes large. ? Bothers you. ? Has a chance of turning into cancer.  Do not try to open a cyst yourself. Follow these instructions at home:  Take over-the-counter and prescription medicines only as told by your health care provider.  If you were prescribed an antibiotic medicine, take it it as told by your health care provider. Do not stop using the antibiotic even if you start to feel better.  Keep the area around your cyst clean and dry.  Wear loose, dry clothing.  Avoid touching your cyst.  Check your cyst every day for signs of infection. Check for: ? Redness, swelling, or pain. ? Fluid or blood. ? Warmth. ? Pus or a bad smell.  Keep all follow-up visits  as told by your health care provider. This is important. How is this prevented?  Wear clean, dry, clothing.  Avoid wearing tight clothing.  Keep your skin clean and dry. Take showers or baths every day. Contact a health care provider if:  Your cyst develops symptoms of infection.  Your condition is not improving or is getting worse.  You develop a cyst that looks different from other cysts you have had.  You have a fever. Get  help right away if:  Redness spreads from the cyst into the surrounding area. Summary  An epidermal cyst is a sac made of skin tissue. These cysts are usually harmless (benign), and they may not cause symptoms unless they become infected.  If a cyst becomes infected, treatment may include surgery to open and drain the cyst, or to remove it. Treatment may also include medicines by mouth or through an injection.  Take over-the-counter and prescription medicines only as told by your health care provider. If you were prescribed an antibiotic medicine, take it as told by your health care provider. Do not stop using the antibiotic even if you start to feel better.  Contact a health care provider if your condition is not improving or is getting worse.  Keep all follow-up visits as told by your health care provider. This is important. This information is not intended to replace advice given to you by your health care provider. Make sure you discuss any questions you have with your health care provider. Document Released: 05/14/2004 Document Revised: 12/25/2017 Document Reviewed: 12/25/2017 Elsevier Interactive Patient Education  2019 [employer].

## 2018-12-19 NOTE — Progress Notes (Signed)
Subjective  CC:  Chief Complaint  Patient presents with  . Cyst    On head, has been there a while but growing in size. Reports some soreness    HPI: Evelyn Chase is a 48 y.o. female who presents to the office today to address the problems listed above in the chief complaint.  Evelyn Chase reports that she has history of a sebaceous cyst on her scalp that in the past required I&D.  On the last week or so she had noticed some mild tenderness.  Size of the cyst has been increasing over the last several months as well.  She denies drainage, pain, fevers or chills. Assessment  1. Epidermoid cyst      Plan   Epidermoid cyst, scalp: Recurrent and mildly infected but no abscess.  Doxycycline x7 days and referral to general surgery for excision.  Follow up:   02/11/2019  Orders Placed This Encounter  Procedures  . Ambulatory referral to General Surgery   Meds ordered this encounter  Medications  . doxycycline (VIBRA-TABS) 100 MG tablet    Sig: Take 1 tablet (100 mg total) by mouth 2 (two) times daily for 7 days.    Dispense:  14 tablet    Refill:  0      I reviewed the patients updated PMH, FH, and SocHx.    Patient Active Problem List   Diagnosis Date Noted  . Adenomatous polyp 11/06/2017    Priority: High  . Essential hypertension 02/28/2012    Priority: High  . GERD without esophagitis 06/11/2018    Priority: Medium  . Inappropriate sinus tachycardia 11/06/2017    Priority: Medium  . Oral contraceptive use 11/06/2017    Priority: Medium  . Subclinical hypothyroidism 11/06/2017    Priority: Medium  . Chronic diarrhea 11/06/2017    Priority: Medium  . Obesity (BMI 30-39.9) 11/06/2017    Priority: Medium  . Cough variant asthma 07/06/2007    Priority: Medium  . Bilateral low frequency hearing loss 11/06/2017    Priority: Low  . Seasonal and perennial allergic rhinitis 07/06/2007    Priority: Low   Current Meds  Medication Sig  . albuterol (PROAIR HFA) 108  (90 Base) MCG/ACT inhaler Inhale 2 puffs into the lungs 4 (four) times daily as needed.  Marland Kitchen amLODipine (NORVASC) 5 MG tablet Take 1 tablet (5 mg total) by mouth daily.  . Ascorbic Acid (VITAMIN C) 500 MG CAPS Take 1 tablet by mouth 2 (two) times daily.  Marland Kitchen azelastine (ASTELIN) 0.1 % nasal spray Place into the nose.  . budesonide-formoterol (SYMBICORT) 160-4.5 MCG/ACT inhaler Inhale into the lungs.  . calcium carbonate (OS-CAL) 600 MG TABS tablet Take 600 mg by mouth daily.  . cetirizine (ZYRTEC) 5 MG tablet Take by mouth.  . CORLANOR 5 MG TABS tablet TAKE 1 TABLET TWICE A DAY  WITH MEALS  . ENSKYCE 0.15-30 MG-MCG tablet TAKE 1 TABLET DAILY  . EPINEPHrine (EPI-PEN) 0.3 mg/0.3 mL SOAJ injection Inject into thigh for severe allergic reaction  . losartan-hydrochlorothiazide (HYZAAR) 100-25 MG tablet TAKE 1 TABLET DAILY  . metoprolol (TOPROL-XL) 200 MG 24 hr tablet Take 1 tablet (200 mg total) by mouth daily.  . Multiple Vitamin (MULTIVITAMIN) tablet Take 1 tablet by mouth daily.  Marland Kitchen omeprazole (PRILOSEC) 20 MG capsule TAKE 1 CAPSULE DAILY  . [DISCONTINUED] triamcinolone cream (KENALOG) 0.1 % Apply 1 application topically 2 (two) times daily. For 2 weeks, then as needed    Allergies: Patient is allergic to codeine.  Family History: Patient family history includes Arthritis in her father; Asthma in her father; COPD in her father; Dementia in her mother; Diabetes in her father; Emphysema in her father; Heart attack in her father; Heart disease in her father; Hyperlipidemia in her father; Hypertension in her father; Lung cancer (age of onset: 79) in her mother; Tuberculosis in her father. Social History:  Patient  reports that she has never smoked. She has never used smokeless tobacco. She reports current alcohol use. She reports that she does not use drugs.  Review of Systems: Constitutional: Negative for fever malaise or anorexia Cardiovascular: negative for chest pain Respiratory: negative for SOB  or persistent cough Gastrointestinal: negative for abdominal pain  Objective  Vitals: BP 130/76   Pulse 89   Temp 98.4 F (36.9 C) (Oral)   Resp 16   Ht 5\' 3"  (1.6 m)   Wt 171 lb 12.8 oz (77.9 kg)   LMP 12/06/2018   SpO2 98%   BMI 30.43 kg/m  General: no acute distress , A&Ox3 Scalp: 1.5 to 2 cm minimally tender nonfluctuant sebaceous cyst visible the top of her head.    Commons side effects, risks, benefits, and alternatives for medications and treatment plan prescribed today were discussed, and the patient expressed understanding of the given instructions. Patient is instructed to call or message via MyChart if he/she has any questions or concerns regarding our treatment plan. No barriers to understanding were identified. We discussed Red Flag symptoms and signs in detail. Patient expressed understanding regarding what to do in case of urgent or emergency type symptoms.   Medication list was reconciled, printed and provided to the patient in AVS. Patient instructions and summary information was reviewed with the patient as documented in the AVS. This note was prepared with assistance of Dragon voice recognition software. Occasional wrong-word or sound-a-like substitutions may have occurred due to the inherent limitations of voice recognition software

## 2018-12-31 ENCOUNTER — Encounter: Payer: Self-pay | Admitting: Family Medicine

## 2019-01-11 DIAGNOSIS — L723 Sebaceous cyst: Secondary | ICD-10-CM | POA: Diagnosis not present

## 2019-01-21 DIAGNOSIS — Z1231 Encounter for screening mammogram for malignant neoplasm of breast: Secondary | ICD-10-CM | POA: Diagnosis not present

## 2019-01-21 LAB — HM MAMMOGRAPHY

## 2019-01-24 ENCOUNTER — Encounter: Payer: Self-pay | Admitting: Family Medicine

## 2019-02-04 ENCOUNTER — Other Ambulatory Visit: Payer: Self-pay | Admitting: Surgery

## 2019-02-04 DIAGNOSIS — L7211 Pilar cyst: Secondary | ICD-10-CM | POA: Diagnosis not present

## 2019-02-11 ENCOUNTER — Other Ambulatory Visit: Payer: Self-pay

## 2019-02-11 ENCOUNTER — Encounter: Payer: Self-pay | Admitting: Family Medicine

## 2019-02-11 ENCOUNTER — Ambulatory Visit (INDEPENDENT_AMBULATORY_CARE_PROVIDER_SITE_OTHER): Payer: 59 | Admitting: Family Medicine

## 2019-02-11 VITALS — BP 110/68 | HR 77 | Temp 98.1°F | Resp 16 | Ht 63.0 in | Wt 172.0 lb

## 2019-02-11 DIAGNOSIS — E038 Other specified hypothyroidism: Secondary | ICD-10-CM

## 2019-02-11 DIAGNOSIS — R000 Tachycardia, unspecified: Secondary | ICD-10-CM | POA: Diagnosis not present

## 2019-02-11 DIAGNOSIS — K219 Gastro-esophageal reflux disease without esophagitis: Secondary | ICD-10-CM | POA: Diagnosis not present

## 2019-02-11 DIAGNOSIS — I10 Essential (primary) hypertension: Secondary | ICD-10-CM

## 2019-02-11 DIAGNOSIS — Z0000 Encounter for general adult medical examination without abnormal findings: Secondary | ICD-10-CM

## 2019-02-11 DIAGNOSIS — E669 Obesity, unspecified: Secondary | ICD-10-CM | POA: Diagnosis not present

## 2019-02-11 DIAGNOSIS — E039 Hypothyroidism, unspecified: Secondary | ICD-10-CM

## 2019-02-11 DIAGNOSIS — I4711 Inappropriate sinus tachycardia, so stated: Secondary | ICD-10-CM

## 2019-02-11 DIAGNOSIS — J45991 Cough variant asthma: Secondary | ICD-10-CM | POA: Diagnosis not present

## 2019-02-11 LAB — COMPREHENSIVE METABOLIC PANEL
ALT: 15 U/L (ref 0–35)
AST: 16 U/L (ref 0–37)
Albumin: 4.4 g/dL (ref 3.5–5.2)
Alkaline Phosphatase: 91 U/L (ref 39–117)
BUN: 15 mg/dL (ref 6–23)
CO2: 22 mEq/L (ref 19–32)
Calcium: 10.1 mg/dL (ref 8.4–10.5)
Chloride: 100 mEq/L (ref 96–112)
Creatinine, Ser: 0.91 mg/dL (ref 0.40–1.20)
GFR: 65.57 mL/min (ref >60.00)
Glucose, Bld: 94 mg/dL (ref 70–99)
Potassium: 4.7 mEq/L (ref 3.5–5.1)
Sodium: 137 mEq/L (ref 135–145)
Total Bilirubin: 0.5 mg/dL (ref 0.2–1.2)
Total Protein: 7.4 g/dL (ref 6.0–8.3)

## 2019-02-11 LAB — T4, FREE: Free T4: 0.8 ng/dL (ref 0.60–1.60)

## 2019-02-11 LAB — CBC WITH DIFFERENTIAL/PLATELET
Basophils Absolute: 0.1 10*3/uL (ref 0.0–0.1)
Basophils Relative: 0.6 % (ref 0.0–3.0)
Eosinophils Absolute: 0.1 10*3/uL (ref 0.0–0.7)
Eosinophils Relative: 1.3 % (ref 0.0–5.0)
HCT: 40.2 % (ref 36.0–46.0)
Hemoglobin: 13.4 g/dL (ref 12.0–15.0)
Lymphocytes Relative: 22.3 % (ref 12.0–46.0)
Lymphs Abs: 2.3 10*3/uL (ref 0.7–4.0)
MCHC: 33.2 g/dL (ref 30.0–36.0)
MCV: 101.4 fl — ABNORMAL HIGH (ref 78.0–100.0)
Monocytes Absolute: 0.7 10*3/uL (ref 0.1–1.0)
Monocytes Relative: 6.5 % (ref 3.0–12.0)
Neutro Abs: 7 10*3/uL (ref 1.4–7.7)
Neutrophils Relative %: 69.3 % (ref 43.0–77.0)
Platelets: 200 10*3/uL (ref 150.0–400.0)
RBC: 3.97 Mil/uL (ref 3.87–5.11)
RDW: 13 % (ref 11.5–15.5)
WBC: 10.1 10*3/uL (ref 4.0–10.5)

## 2019-02-11 LAB — TSH: TSH: 6.48 u[IU]/mL — ABNORMAL HIGH (ref 0.35–4.50)

## 2019-02-11 LAB — LIPID PANEL
Cholesterol: 180 mg/dL (ref 0–200)
HDL: 91.6 mg/dL (ref >39.00)
LDL Cholesterol: 53 mg/dL (ref 0–99)
NonHDL: 88.1
Total CHOL/HDL Ratio: 2
Triglycerides: 175 mg/dL — ABNORMAL HIGH (ref 0.0–149.0)
VLDL: 35 mg/dL (ref 0.0–40.0)

## 2019-02-11 NOTE — Patient Instructions (Addendum)
Please return in 6 months for follow up of your hypertension.  I will release your lab results to you on your MyChart account with further instructions. Please reply with any questions.    If you have any questions or concerns, please don't hesitate to send me a message via MyChart or call the office at [phone]. Thank you for visiting with [place] today! It's our pleasure caring for you.   Preventive Care 71-49 Years Old, Female Preventive care refers to visits with your health care provider and lifestyle choices that can promote health and wellness. This includes:  A yearly physical exam. This may also be called an annual well check.  Regular dental visits and eye exams.  Immunizations.  Screening for certain conditions.  Healthy lifestyle choices, such as eating a healthy diet, getting regular exercise, not using drugs or products that contain nicotine and tobacco, and limiting alcohol use. What can I expect for my preventive care visit? Physical exam Your health care provider will check your:  Height and weight. This may be used to calculate body mass index (BMI), which tells if you are at a healthy weight.  Heart rate and blood pressure.  Skin for abnormal spots. Counseling Your health care provider may ask you questions about your:  Alcohol, tobacco, and drug use.  Emotional well-being.  Home and relationship well-being.  Sexual activity.  Eating habits.  Work and work Statistician.  Method of birth control.  Menstrual cycle.  Pregnancy history. What immunizations do I need?  Influenza (flu) vaccine  This is recommended every year. Tetanus, diphtheria, and pertussis (Tdap) vaccine  You may need a Td booster every 10 years. Varicella (chickenpox) vaccine  You may need this if you have not been vaccinated. Zoster (shingles) vaccine  You may need this after age 62. Measles, mumps, and rubella (MMR) vaccine  You may need at least one dose of MMR if you  were born in 1957 or later. You may also need a second dose. Pneumococcal conjugate (PCV13) vaccine  You may need this if you have certain conditions and were not previously vaccinated. Pneumococcal polysaccharide (PPSV23) vaccine  You may need one or two doses if you smoke cigarettes or if you have certain conditions. Meningococcal conjugate (MenACWY) vaccine  You may need this if you have certain conditions. Hepatitis A vaccine  You may need this if you have certain conditions or if you travel or work in places where you may be exposed to hepatitis A. Hepatitis B vaccine  You may need this if you have certain conditions or if you travel or work in places where you may be exposed to hepatitis B. Haemophilus influenzae type b (Hib) vaccine  You may need this if you have certain conditions. Human papillomavirus (HPV) vaccine  If recommended by your health care provider, you may need three doses over 6 months. You may receive vaccines as individual doses or as more than one vaccine together in one shot (combination vaccines). Talk with your health care provider about the risks and benefits of combination vaccines. What tests do I need? Blood tests  Lipid and cholesterol levels. These may be checked every 5 years, or more frequently if you are over 44 years old.  Hepatitis C test.  Hepatitis B test. Screening  Lung cancer screening. You may have this screening every year starting at age 26 if you have a 30-pack-year history of smoking and currently smoke or have quit within the past 15 years.  Colorectal cancer screening.  All adults should have this screening starting at age 70 and continuing until age 63. Your health care provider may recommend screening at age 75 if you are at increased risk. You will have tests every 1-10 years, depending on your results and the type of screening test.  Diabetes screening. This is done by checking your blood sugar (glucose) after you have not  eaten for a while (fasting). You may have this done every 1-3 years.  Mammogram. This may be done every 1-2 years. Talk with your health care provider about when you should start having regular mammograms. This may depend on whether you have a family history of breast cancer.  BRCA-related cancer screening. This may be done if you have a family history of breast, ovarian, tubal, or peritoneal cancers.  Pelvic exam and Pap test. This may be done every 3 years starting at age 65. Starting at age 67, this may be done every 5 years if you have a Pap test in combination with an HPV test. Other tests  Sexually transmitted disease (STD) testing.  Bone density scan. This is done to screen for osteoporosis. You may have this scan if you are at high risk for osteoporosis. Follow these instructions at home: Eating and drinking  Eat a diet that includes fresh fruits and vegetables, whole grains, lean protein, and low-fat dairy.  Take vitamin and mineral supplements as recommended by your health care provider.  Do not drink alcohol if: ? Your health care provider tells you not to drink. ? You are pregnant, may be pregnant, or are planning to become pregnant.  If you drink alcohol: ? Limit how much you have to 0-1 drink a day. ? Be aware of how much alcohol is in your drink. In the [place] one drink equals one 12 oz bottle of beer (355 mL), one 5 oz glass of wine (148 mL), or one 1 oz glass of hard liquor (44 mL). Lifestyle  Take daily care of your teeth and gums.  Stay active. Exercise for at least 30 minutes on 5 or more days each week.  Do not use any products that contain nicotine or tobacco, such as cigarettes, e-cigarettes, and chewing tobacco. If you need help quitting, ask your health care provider.  If you are sexually active, practice safe sex. Use a condom or other form of birth control (contraception) in order to prevent pregnancy and STIs (sexually transmitted infections).  If told  by your health care provider, take low-dose aspirin daily starting at age 58. What's next?  Visit your health care provider once a year for a well check visit.  Ask your health care provider how often you should have your eyes and teeth checked.  Stay up to date on all vaccines. This information is not intended to replace advice given to you by your health care provider. Make sure you discuss any questions you have with your health care provider. Document Released: 07/10/2015 Document Revised: 02/22/2018 Document Reviewed: 02/22/2018 Elsevier Patient Education  2020 [employer].

## 2019-02-11 NOTE — Progress Notes (Signed)
Subjective  Chief Complaint  Patient presents with  . Annual Exam    Fasting  . Hypertension  . Gastroesophageal Reflux    HPI: Evelyn Chase is a 48 y.o. female who presents to [facility] [place] today for a Female Wellness Visit. She also has the concerns and/or needs as listed above in the chief complaint. These will be addressed in addition to the Health Maintenance Visit.   Wellness Visit: annual visit with health maintenance review and exam without Pap   HM: screens up to date. Next year will need crc screen and pap. Feeling well. No concerns.  Chronic disease f/u and/or acute problem visit: (deemed necessary to be done in addition to the wellness visit):  Asthma: stable w/o recent flares. Using meds. No wheezing. Rare rescue inhaler need. Rare cough.   Allergies: active on meds.  IST: on meds per cards. I reviewed notes. Stable now.   Subclinical hypothyroidism: due for recheck. No sxs of low or high thyroid.   gerd and HTN are controlled. Feeling well. Taking medications w/o adverse effects. No symptoms of CHF, angina; no palpitations, sob, cp or lower extremity edema. Compliant with meds.   Weight is stable . Wt Readings from Last 3 Encounters:  02/11/19 172 lb (78 kg)  12/19/18 171 lb 12.8 oz (77.9 kg)  06/11/18 175 lb 12.8 oz (79.7 kg)   Assessment  1. Annual physical exam   2. Essential hypertension   3. GERD without esophagitis   4. Subclinical hypothyroidism   5. Obesity (BMI 30-39.9)   6. Inappropriate sinus tachycardia   7. Cough variant asthma      Plan  Female Wellness Visit:  Age appropriate Health Maintenance and Prevention measures were discussed with patient. Included topics are cancer screening recommendations, ways to keep healthy (see AVS) including dietary and exercise recommendations, regular eye and dental care, use of seat belts, and avoidance of moderate alcohol use and tobacco use. Screens are Up to date.    BMI: discussed patient's BMI and encouraged positive lifestyle modifications to help get to or maintain a target BMI.  HM needs and immunizations were addressed and ordered. See below for orders. See HM and immunization section for updates.  Routine labs and screening tests ordered including cmp, cbc and lipids where appropriate.  Discussed recommendations regarding Vit D and calcium supplementation (see AVS)  Chronic disease management visit and/or acute problem visit:  HTN is controlled.   GERD controlled.   Asthma controlled.  No change in meds at this time.  Follow up: Return in about 6 months (around 08/14/2019) for follow up Hypertension.  Orders Placed This Encounter  Procedures  . CBC with Differential/Platelet  . Comprehensive metabolic panel  . Lipid panel  . TSH  . T4, free  . T3   No orders of the defined types were placed in this encounter.     Lifestyle: Body mass index is 30.47 kg/m. Wt Readings from Last 3 Encounters:  02/11/19 172 lb (78 kg)  12/19/18 171 lb 12.8 oz (77.9 kg)  06/11/18 175 lb 12.8 oz (79.7 kg)    Patient Active Problem List   Diagnosis Date Noted  . Adenomatous polyp 11/06/2017    Priority: High    Dr. Watt Climes, q 3 year colonoscopy surveillance   . Essential hypertension 02/28/2012    Priority: High  . GERD without esophagitis 06/11/2018    Priority: Medium  . Inappropriate sinus tachycardia 11/06/2017    Priority: Medium  Evaluated January 2019 by cards, Dr. Carlis Abbott, [place] [facility]; nl echocardiogam. Started ivabradine to slow heart rate in addition to BB.   . Oral contraceptive use 11/06/2017    Priority: Medium  . Subclinical hypothyroidism 11/06/2017    Priority: Medium  . Chronic diarrhea 11/06/2017    Priority: Medium    ? Malabsorption. Requesting records from GI.   Marland Kitchen Obesity (BMI 30-39.9) 11/06/2017    Priority: Medium    Seeing a nutritionist 2019   . Cough variant asthma 07/06/2007    Priority: Medium     Allergies are trigger; reports tolerated high dose beta-blocker   . 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 Seasonal and perennial allergic rhinitis 07/06/2007    Priority: Low    Allergy vaccine 1:50,000 07/25/2007, 1:10 06/16/2008, [facility]     Health Maintenance  Topic Date Due  . INFLUENZA VACCINE  01/26/2019  . MAMMOGRAM  01/21/2020  . PAP SMEAR-Modifier  02/24/2020  . TETANUS/TDAP  11/09/2022  . HIV Screening  Completed   Immunization History  Administered Date(s) Administered  . Influenza Split 03/27/2012, 03/27/2013, 04/06/2014, 03/28/2015  . Influenza, Seasonal, Injecte, Preservative Fre 03/28/2015, 03/31/2016  . Influenza,inj,Quad PF,6+ Mos 03/08/2018  . Pneumococcal Polysaccharide-23 02/24/2015, 04/28/2015  . Tdap 11/08/2012   We updated and reviewed the patient's past history in detail and it is documented below. Allergies: Patient is allergic to codeine. Past Medical History Patient  has a past medical history of Adenomatous polyp (11/06/2017), Allergic rhinitis, Asthma, Chronic diarrhea (11/06/2017), Chronic sinusitis with recurrent bronchitis, Hypertension, Obesity (BMI 30-39.9) (11/06/2017), Pneumonia, and Subclinical hypothyroidism (11/06/2017). Past Surgical History Patient  has a past surgical history that includes Breast enhancement surgery; Knee surgery; Tendon release; Shoulder surgery; Cholecystectomy (N/A, 07/22/2016); Finger surgery; and bilateral tempromandibular joint arthroplasty. Family History: Patient family history includes Arthritis in her father; Asthma in her father; COPD in her father; Dementia in her mother; Diabetes in her father; Emphysema in her father; Heart attack in her father; Heart disease in her father; Hyperlipidemia in her father; Hypertension in her father; Lung cancer (age of onset: 41) in her mother; Tuberculosis in her father. Social History:  Patient   reports that she has never smoked. She has never used smokeless tobacco. She reports current alcohol use. She reports that she does not use drugs.  Review of Systems: Constitutional: negative for fever or malaise Ophthalmic: negative for photophobia, double vision or loss of vision Cardiovascular: negative for chest pain, dyspnea on exertion, or new LE swelling Respiratory: negative for SOB or persistent cough Gastrointestinal: negative for abdominal pain, change in bowel habits or melena Genitourinary: negative for dysuria or gross hematuria, no abnormal uterine bleeding or disharge Musculoskeletal: negative for new gait disturbance or muscular weakness Integumentary: negative for new or persistent rashes, no breast lumps Neurological: negative for TIA or stroke symptoms Psychiatric: negative for SI or delusions Allergic/Immunologic: negative for hives  Patient Care Team    Relationship Specialty Notifications Start End  Leamon Arnt, MD PCP - General Family Medicine  11/06/17   Clarene Essex, MD Consulting Physician Gastroenterology  11/06/17   Lorenda Cahill, DMD  Dentistry  11/06/17     Objective  Vitals: BP 110/68   Pulse 77   Temp 98.1 F (36.7 C) (Tympanic)   Resp 16   Ht 5\' 3"  (1.6 m)   Wt 172 lb (78 kg)   LMP 01/24/2019  SpO2 97%   BMI 30.47 kg/m  General:  Well developed, well nourished, no acute distress  Psych:  Alert and orientedx3,normal mood and affect HEENT:  Normocephalic, atraumatic, non-icteric sclera, PERRL, oropharynx is clear without mass or exudate, supple neck without adenopathy, mass or thyromegaly Cardiovascular:  Normal S1, S2, RRR without gallop, rub or murmur, nondisplaced PMI Respiratory:  Good breath sounds bilaterally, CTAB with normal respiratory effort Gastrointestinal: normal bowel sounds, soft, non-tender, no noted masses. No HSM MSK: no deformities, contusions. Joints are without erythema or swelling. Spine and CVA region are nontender Skin:   Warm, no rashes or suspicious lesions noted Neurologic:    Mental status is normal. CN 2-11 are normal. Gross motor and sensory exams are normal. Normal gait. No tremor Breast Exam: No mass, skin retraction or nipple discharge is appreciated in either breast. No axillary adenopathy. Fibrocystic changes are not noted    Commons side effects, risks, benefits, and alternatives for medications and treatment plan prescribed today were discussed, and the patient expressed understanding of the given instructions. Patient is instructed to call or message via MyChart if he/she has any questions or concerns regarding our treatment plan. No barriers to understanding were identified. We discussed Red Flag symptoms and signs in detail. Patient expressed understanding regarding what to do in case of urgent or emergency type symptoms.   Medication list was reconciled, printed and provided to the patient in AVS. Patient instructions and summary information was reviewed with the patient as documented in the AVS. This note was prepared with assistance of Dragon voice recognition software. Occasional wrong-word or sound-a-like substitutions may have occurred due to the inherent limitations of voice recognition software

## 2019-02-12 LAB — T3: T3, Total: 207 ng/dL — ABNORMAL HIGH (ref 76–181)

## 2019-02-15 ENCOUNTER — Other Ambulatory Visit: Payer: Self-pay | Admitting: Family Medicine

## 2019-03-10 ENCOUNTER — Other Ambulatory Visit: Payer: Self-pay | Admitting: Family Medicine

## 2019-04-04 ENCOUNTER — Other Ambulatory Visit: Payer: Self-pay | Admitting: Family Medicine

## 2019-05-26 ENCOUNTER — Other Ambulatory Visit: Payer: Self-pay | Admitting: Family Medicine

## 2019-07-08 ENCOUNTER — Encounter: Payer: Self-pay | Admitting: Family Medicine

## 2019-07-08 ENCOUNTER — Other Ambulatory Visit: Payer: Self-pay

## 2019-07-08 MED ORDER — ENSKYCE 0.15-30 MG-MCG PO TABS: 1.0000 | ORAL_TABLET | Freq: Every day | ORAL | 0 refills | Status: DC

## 2019-08-27 ENCOUNTER — Other Ambulatory Visit: Payer: Self-pay

## 2019-08-27 MED ORDER — LOSARTAN POTASSIUM-HCTZ 100-25 MG PO TABS: 1.0000 | ORAL_TABLET | Freq: Every day | ORAL | 3 refills | Status: DC

## 2019-09-28 ENCOUNTER — Ambulatory Visit: Payer: 59 | Attending: Internal Medicine

## 2019-09-28 DIAGNOSIS — Z23 Encounter for immunization: Secondary | ICD-10-CM

## 2019-09-28 NOTE — Progress Notes (Signed)
   Covid-19 Vaccination Clinic  Name:  Kayna Xie    MRN: [id number] DOB: [birth year]  09/28/2019  Ms. Hogancamp was observed post Covid-19 immunization for 15 minutes without incident. She was provided with Vaccine Information Sheet and instruction to access the V-Safe system.   Ms. Repko was instructed to call 911 with any severe reactions post vaccine: Marland Kitchen Difficulty breathing  . Swelling of face and throat  . A fast heartbeat  . A bad rash all over body  . Dizziness and weakness   Immunizations Administered    Name Date Dose VIS Date Route   Pfizer COVID-19 Vaccine 09/28/2019  2:58 PM 0.3 mL 06/07/2019 Intramuscular   Manufacturer: [place]   Lot: [id number]   [place]: [id number]

## 2019-10-22 ENCOUNTER — Ambulatory Visit: Payer: 59 | Attending: Internal Medicine

## 2019-10-22 DIAGNOSIS — Z23 Encounter for immunization: Secondary | ICD-10-CM

## 2019-10-22 NOTE — Progress Notes (Signed)
   Covid-19 Vaccination Clinic  Name:  Mazlyn Russin    MRN: [id number] DOB: 01-Mar-1971  10/22/2019  Ms. Citrano was observed post Covid-19 immunization for 15 minutes without incident. She was provided with Vaccine Information Sheet and instruction to access the V-Safe system.   Ms. Self was instructed to call 911 with any severe reactions post vaccine: Marland Kitchen Difficulty breathing  . Swelling of face and throat  . A fast heartbeat  . A bad rash all over body  . Dizziness and weakness   Immunizations Administered    Name Date Dose VIS Date Route   Pfizer COVID-19 Vaccine 10/22/2019 10:36 AM 0.3 mL 08/21/2018 Intramuscular   Manufacturer: [place]   Lot: [id number]   [place]: [id number]

## 2019-11-18 ENCOUNTER — Encounter: Payer: Self-pay | Admitting: Family Medicine

## 2020-01-07 ENCOUNTER — Ambulatory Visit (INDEPENDENT_AMBULATORY_CARE_PROVIDER_SITE_OTHER)
Admission: RE | Admit: 2020-01-07 | Discharge: 2020-01-07 | Disposition: A | Discharge status: Home / Self Care | Payer: 59 | Source: Ambulatory Visit

## 2020-01-07 ENCOUNTER — Encounter: Payer: Self-pay | Admitting: Family Medicine

## 2020-01-07 DIAGNOSIS — M6740 Ganglion, unspecified site: Secondary | ICD-10-CM

## 2020-01-07 DIAGNOSIS — M25532 Pain in left wrist: Secondary | ICD-10-CM | POA: Diagnosis not present

## 2020-01-07 MED ORDER — PREDNISONE 10 MG (21) PO TBPK: ORAL_TABLET | Freq: Every day | ORAL | 0 refills | Status: AC

## 2020-01-07 NOTE — Discharge Instructions (Addendum)
I have sent in a prednisone taper for you to take for 6 days. 6 tablets on day one, 5 tablets on day two, 4 tablets on day three, 3 tablets on day four, 2 tablets on day five, and 1 tablet on day six.  If this is not helping, he may need to follow-up with a hand specialist.

## 2020-01-07 NOTE — ED Provider Notes (Signed)
[place]  Virtual Visit via Video Note:  Evelyn Chase  initiated request for Telemedicine visit with [facility] team. I connected with Evelyn Chase  on 01/07/2020 at 10:50 AM  for a synchronized telemedicine visit using a video enabled HIPPA compliant telemedicine application. I verified that I am speaking with Evelyn Chase  using two identifiers. Bettey Mare, NP  was physically located in a [facility] Urgent care site and Akina Maish was located at a different location.   The limitations of evaluation and management by telemedicine as well as the availability of in-person appointments were discussed. Patient was informed that she  may incur a bill ( including co-pay) for this virtual visit encounter. Evelyn Chase  expressed understanding and gave verbal consent to proceed with virtual visit.   [id number] 01/07/20 Arrival Time: 1041  ZD:GLOVF PAIN  SUBJECTIVE: History from: patient.  Evelyn Chase is a 49 y.o. female who presents with abrupt onset of left wrist pain for the last week and a half.  Reports history of ganglion cyst.  Denies having these surgically removed in the past.  Has been taking Advil and Tylenol with little relief.  Has been wearing wrist braces with little relief.  Reports that she has been having increased swelling in the left wrist as well.  Symptoms are made worse with activity.  There are no alleviating factors.  Reports previous symptoms in the past that have been relieved with anti-inflammatories or steroids.  Denies fever, chills, fatigue, ear pain, sinus pain, rhinorrhea, nasal congestion, cough, SOB, wheezing, chest pain, nausea, rash, changes in bowel or bladder habits.    ROS: As per HPI.  All other pertinent ROS negative.     Past Medical History:  Diagnosis Date  . Adenomatous polyp 11/06/2017   Dr. Watt Climes, q 3 year colonoscopy surveillance  . Allergic rhinitis   . Asthma   .  Chronic diarrhea 11/06/2017   ? Malabsorption. Requesting records from GI.  Marland Kitchen Chronic sinusitis with recurrent bronchitis   . Hypertension   . Obesity (BMI 30-39.9) 11/06/2017   Seeing a nutritionist 2019  . Pneumonia    x 2   . Subclinical hypothyroidism 11/06/2017   Past Surgical History:  Procedure Laterality Date  . bilateral tempromandibular joint arthroplasty    . BREAST ENHANCEMENT SURGERY    . CHOLECYSTECTOMY N/A 07/22/2016   Procedure: LAPAROSCOPIC CHOLECYSTECTOMY;  Surgeon: Georganna Skeans, MD;  Location: [place];  Service: General;  Laterality: N/A;  . FINGER SURGERY     Right Trigger Finger x 2   . KNEE SURGERY     right  . SHOULDER SURGERY     right  . TENDON RELEASE     Right hand   Allergies  Allergen Reactions  . Codeine     REACTION: itching   No current facility-administered medications on file prior to encounter.   Current Outpatient Medications on File Prior to Encounter  Medication Sig Dispense Refill  . albuterol (PROAIR HFA) 108 (90 Base) MCG/ACT inhaler Inhale 2 puffs into the lungs 4 (four) times daily as needed. 1 Inhaler 2  . amLODipine (NORVASC) 5 MG tablet Take 1 tablet (5 mg total) by mouth daily. 90 tablet 3  . Ascorbic Acid (VITAMIN C) 500 MG CAPS Take 1 tablet by mouth 2 (two) times daily.    Marland Kitchen azelastine (ASTELIN) 0.1 % nasal spray Place into the nose.    . budesonide-formoterol (SYMBICORT) 160-4.5 MCG/ACT inhaler Inhale  into the lungs.    . calcium carbonate (OS-CAL) 600 MG TABS tablet Take 600 mg by mouth daily.    . cetirizine (ZYRTEC) 5 MG tablet Take by mouth.    Steffanie Dunn 5 MG TABS tablet TAKE 1 TABLET TWICE A DAY  WITH MEALS 180 tablet 3  . desogestrel-ethinyl estradiol (ENSKYCE) 0.15-30 MG-MCG tablet Take 1 tablet by mouth daily. 30 tablet 0  . EPINEPHrine (EPI-PEN) 0.3 mg/0.3 mL SOAJ injection Inject into thigh for severe allergic reaction 1 Device prn  . losartan-hydrochlorothiazide (HYZAAR) 100-25 MG tablet Take 1 tablet by mouth  daily. 90 tablet 3  . metoprolol (TOPROL-XL) 200 MG 24 hr tablet TAKE 1 TABLET DAILY 90 tablet 3  . Multiple Vitamin (MULTIVITAMIN) tablet Take 1 tablet by mouth daily.    Marland Kitchen omeprazole (PRILOSEC) 20 MG capsule TAKE 1 CAPSULE DAILY;      OFFICE VISIT NEEDED 90 capsule 3   Social History   Socioeconomic History  . Marital status: Married    Spouse name: Not on file  . Number of children: Not on file  . Years of education: Not on file  . Highest education level: Not on file  Occupational History  . Occupation: Best boy: [employer]  Tobacco Use  . Smoking status: Never Smoker  . Smokeless tobacco: Never Used  . Tobacco comment: Grew up in smoking household  Vaping Use  . Vaping Use: Never used  Substance and Sexual Activity  . Alcohol use: Yes    Comment: occ  . Drug use: No  . Sexual activity: Yes    Birth control/protection: Pill  Other Topics Concern  . Not on file  Social History Narrative  . Not on file   Social Determinants of Health   Financial Resource Strain:   . Difficulty of Paying Living Expenses:   Food Insecurity:   . Worried About Charity fundraiser in the Last Year:   . Arboriculturist in the Last Year:   Transportation Needs:   . Film/video editor (Medical):   Marland Kitchen Lack of Transportation (Non-Medical):   Physical Activity:   . Days of Exercise per Week:   . Minutes of Exercise per Session:   Stress:   . Feeling of Stress :   Social Connections:   . Frequency of Communication with Friends and Family:   . Frequency of Social Gatherings with Friends and Family:   . Attends Religious Services:   . Active Member of Clubs or Organizations:   . Attends Archivist Meetings:   Marland Kitchen Marital Status:   Intimate Partner Violence:   . Fear of Current or Ex-Partner:   . Emotionally Abused:   Marland Kitchen Physically Abused:   . Sexually Abused:    Family History  Problem Relation Age of Onset  . Asthma Father   . Emphysema Father   .  Diabetes Father   . Arthritis Father   . COPD Father   . Heart attack Father   . Heart disease Father   . Hyperlipidemia Father   . Hypertension Father   . Tuberculosis Father   . Lung cancer Mother 28       cured, asbestos related  . Dementia Mother        early onset    OBJECTIVE:   There were no vitals filed for this visit.  General appearance: alert; no distress Eyes: EOMI grossly HENT: normocephalic; atraumatic Neck: supple with FROM Lungs: normal respiratory  effort; speaking in full sentences without difficulty Extremities: moves extremities without difficulty Skin: No obvious rashes Neurologic: No facial asymmetries Psychological: alert and cooperative; normal mood and affect  ASSESSMENT & PLAN:  1. Left wrist pain   2. Ganglion cyst     Meds ordered this encounter  Medications  . predniSONE (STERAPRED UNI-PAK 21 TAB) 10 MG (21) TBPK tablet    Sig: Take by mouth daily for 6 days. Take 6 tablets on day 1, 5 tablets on day 2, 4 tablets on day 3, 3 tablets on day 4, 2 tablets on day 5, 1 tablet on day 6    Dispense:  21 tablet    Refill:  0    Order Specific Question:   Supervising Provider    Answer:   Chase Picket [[id number]]     Prescribed prednisone Take OTC ibuprofen or tylenol as needed for pain If these are not helping, he may need to follow-up with the hand specialist Follow up with PCP if symptoms persist Return or go to ER if you have any new or worsening symptoms such as fever, chills, nausea, vomiting, worsening sore throat, cough, abdominal pain, chest pain, changes in bowel or bladder habits  I discussed the assessment and treatment plan with the patient. The patient was provided an opportunity to ask questions and all were answered. The patient agreed with the plan and demonstrated an understanding of the instructions.   The patient was advised to call back or seek an in-person evaluation if the symptoms worsen or if the condition fails to  improve as anticipated.  I provided 10 minutes of non-face-to-face time during this encounter.  Bettey Mare, NP  01/07/2020 10:50 AM         Faustino Congress, NP 01/07/20 1050

## 2020-01-27 DIAGNOSIS — Z1231 Encounter for screening mammogram for malignant neoplasm of breast: Secondary | ICD-10-CM | POA: Diagnosis not present

## 2020-01-27 LAB — HM MAMMOGRAPHY

## 2020-01-28 ENCOUNTER — Encounter: Payer: Self-pay | Admitting: Family Medicine

## 2020-02-04 ENCOUNTER — Other Ambulatory Visit: Payer: Self-pay | Admitting: Family Medicine

## 2020-02-20 ENCOUNTER — Encounter: Payer: Self-pay | Admitting: Family Medicine

## 2020-02-24 ENCOUNTER — Other Ambulatory Visit: Payer: Self-pay | Admitting: Family Medicine

## 2020-03-22 ENCOUNTER — Other Ambulatory Visit: Payer: Self-pay | Admitting: Family Medicine

## 2020-03-25 ENCOUNTER — Encounter: Payer: 59 | Admitting: Family Medicine

## 2020-04-03 IMAGING — DX DG FOOT COMPLETE 3+V*L*
3 series · 3 of 3 positions shown · non-contrast
Comparison: None.

CLINICAL DATA: Dorsal and lateral foot pain for 1 week. No known
injury.

EXAM:
LEFT FOOT - COMPLETE 3+ VIEW

[foot dp]
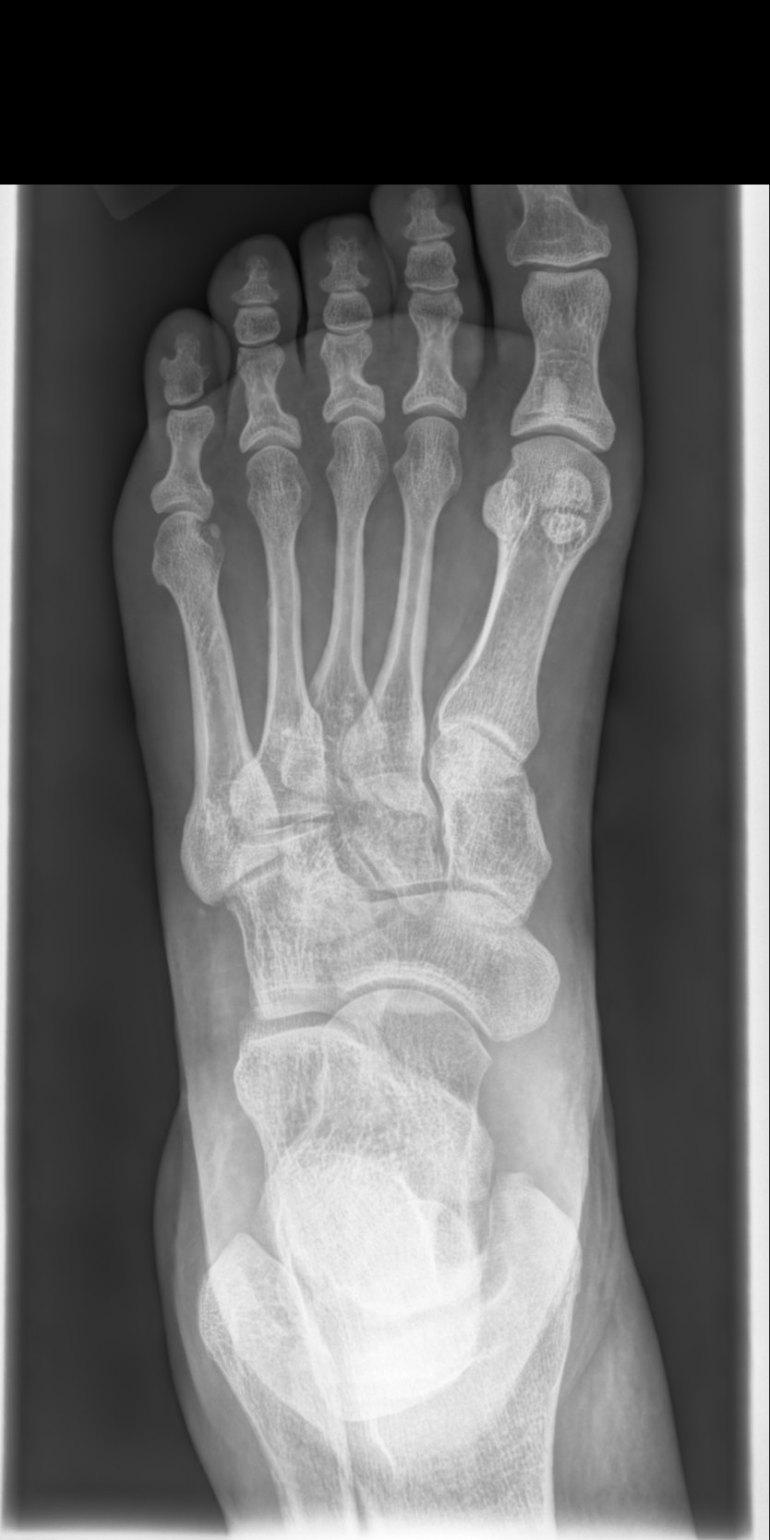

[foot oblique]
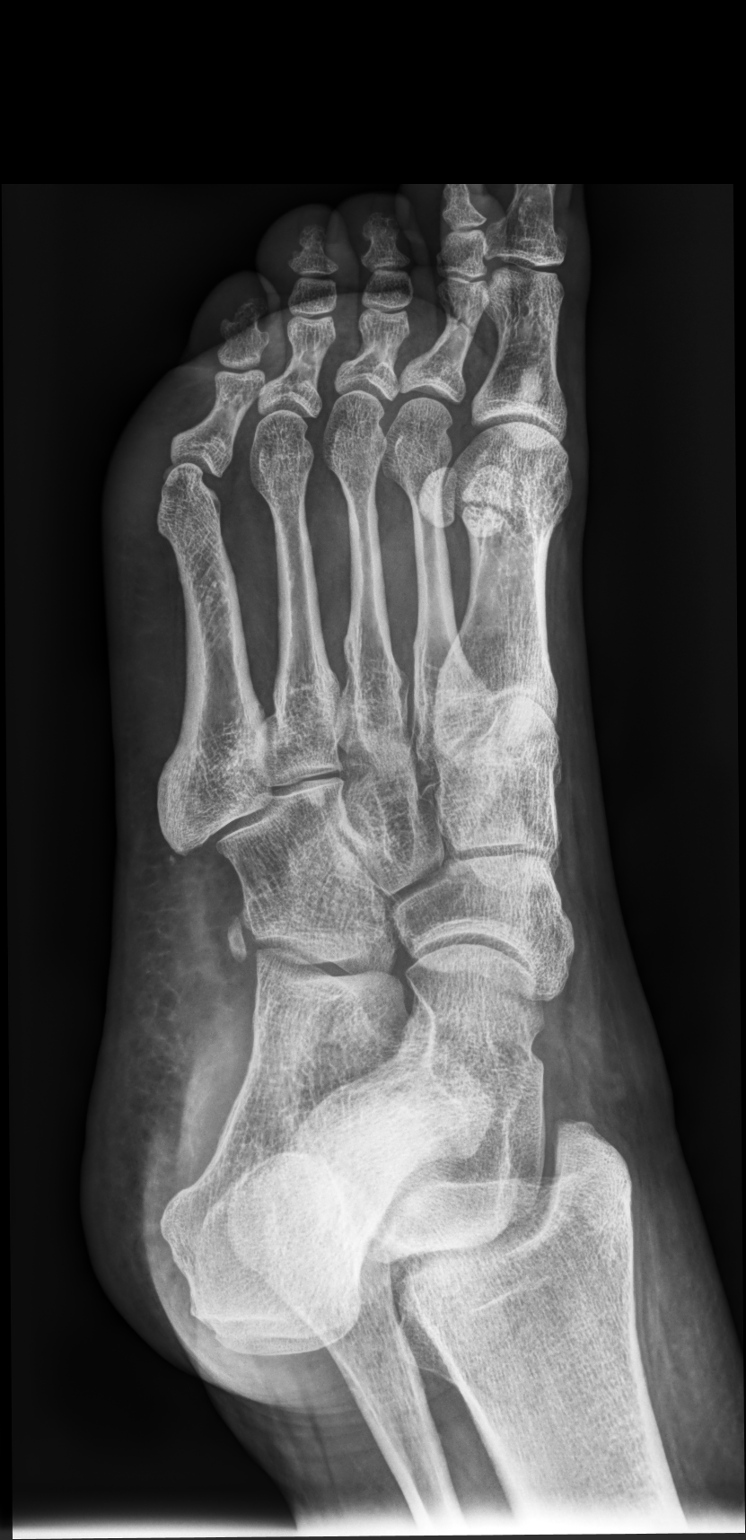

[foot lat]
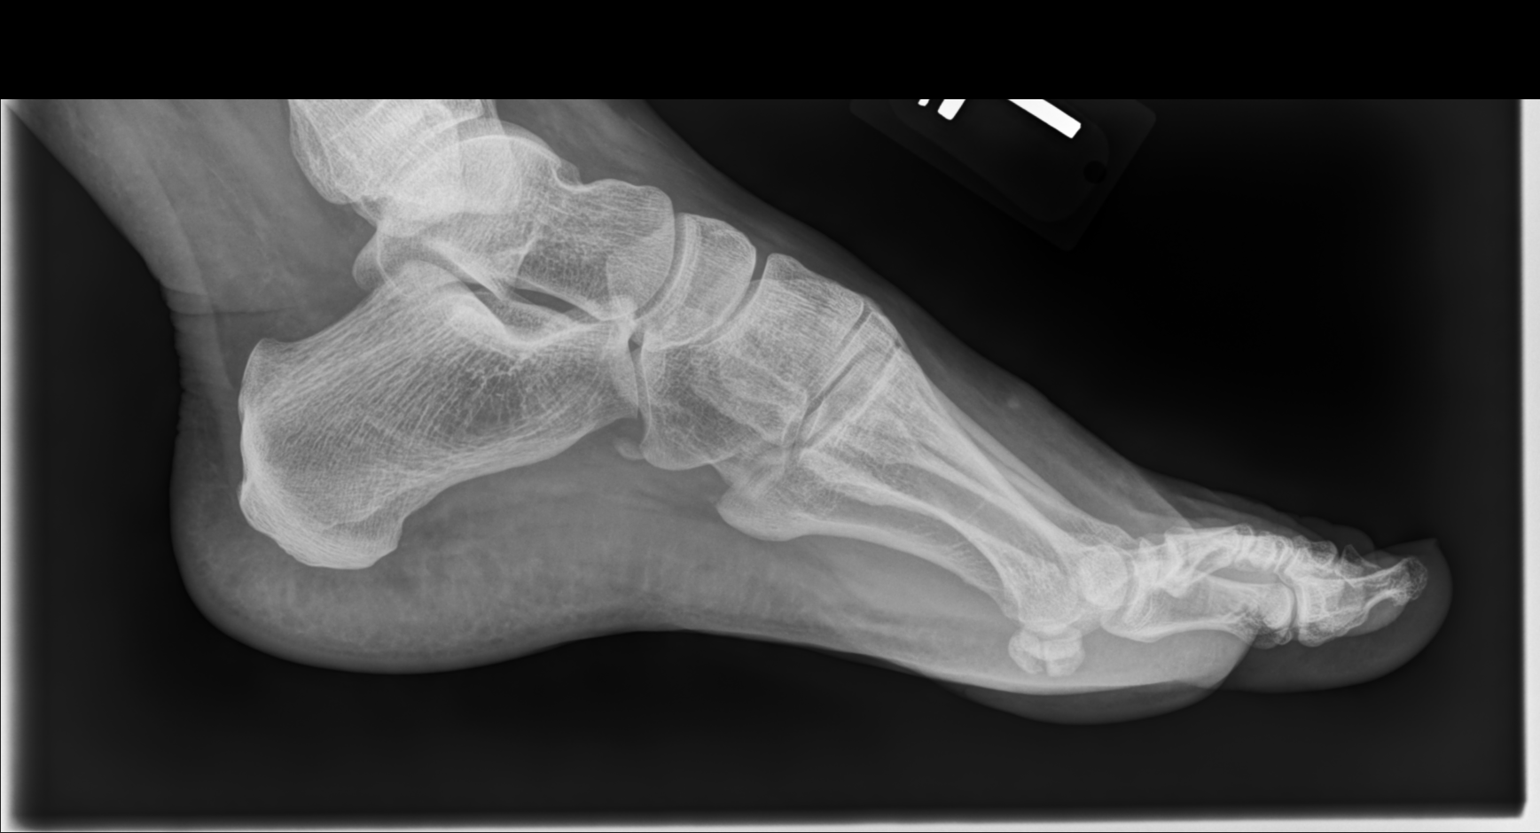

[3 of 3 positions shown; findings below may reference images not displayed]

FINDINGS: There is no evidence of fracture or dislocation. There is no
evidence of arthropathy or other focal bone abnormality. Soft
tissues are unremarkable.
IMPRESSION: Negative.

## 2020-04-27 ENCOUNTER — Other Ambulatory Visit (HOSPITAL_COMMUNITY)
Admission: RE | Admit: 2020-04-27 | Discharge: 2020-04-27 | Disposition: A | Discharge status: Home / Self Care | Payer: 59 | Source: Ambulatory Visit | Attending: Family Medicine | Admitting: Family Medicine

## 2020-04-27 ENCOUNTER — Ambulatory Visit (INDEPENDENT_AMBULATORY_CARE_PROVIDER_SITE_OTHER): Payer: 59 | Admitting: Family Medicine

## 2020-04-27 ENCOUNTER — Other Ambulatory Visit: Payer: Self-pay

## 2020-04-27 ENCOUNTER — Encounter: Payer: Self-pay | Admitting: Family Medicine

## 2020-04-27 VITALS — BP 136/90 | HR 84 | Temp 98.1°F | Ht 63.0 in | Wt 175.6 lb

## 2020-04-27 DIAGNOSIS — Z0000 Encounter for general adult medical examination without abnormal findings: Secondary | ICD-10-CM | POA: Diagnosis not present

## 2020-04-27 DIAGNOSIS — Z23 Encounter for immunization: Secondary | ICD-10-CM | POA: Diagnosis not present

## 2020-04-27 DIAGNOSIS — Z124 Encounter for screening for malignant neoplasm of cervix: Secondary | ICD-10-CM

## 2020-04-27 DIAGNOSIS — R000 Tachycardia, unspecified: Secondary | ICD-10-CM

## 2020-04-27 DIAGNOSIS — Z1159 Encounter for screening for other viral diseases: Secondary | ICD-10-CM | POA: Diagnosis not present

## 2020-04-27 DIAGNOSIS — D369 Benign neoplasm, unspecified site: Secondary | ICD-10-CM | POA: Diagnosis not present

## 2020-04-27 DIAGNOSIS — R600 Localized edema: Secondary | ICD-10-CM

## 2020-04-27 DIAGNOSIS — J45991 Cough variant asthma: Secondary | ICD-10-CM

## 2020-04-27 DIAGNOSIS — I10 Essential (primary) hypertension: Secondary | ICD-10-CM | POA: Diagnosis not present

## 2020-04-27 DIAGNOSIS — E038 Other specified hypothyroidism: Secondary | ICD-10-CM

## 2020-04-27 DIAGNOSIS — Z3041 Encounter for surveillance of contraceptive pills: Secondary | ICD-10-CM | POA: Diagnosis not present

## 2020-04-27 NOTE — Patient Instructions (Signed)
Please return in 6 months for hypertension follow up.  I will release your lab results to you on your MyChart account with further instructions. Please reply with any questions.   You may stop your birth control pills.   If you have any questions or concerns, please don't hesitate to send me a message via MyChart or call the office at [phone]. Thank you for visiting with [place] today! It's our pleasure caring for you.  I have not adjusted your blood pressure medication.  Limit salt in the diet and drink plenty of water.  Return if it is worsening.  Edema  Edema is when you have too much fluid in your body or under your skin. Edema may make your legs, feet, and ankles swell up. Swelling is also common in looser tissues, like around your eyes. This is a common condition. It gets more common as you get older. There are many possible causes of edema. Eating too much salt (sodium) and being on your feet or sitting for a long time can cause edema in your legs, feet, and ankles. Hot weather may make edema worse. Edema is usually painless. Your skin may look swollen or shiny. Follow these instructions at home:  Keep the swollen body part raised (elevated) above the level of your heart when you are sitting or lying down.  Do not sit still or stand for a long time.  Do not wear tight clothes. Do not wear garters on your upper legs.  Exercise your legs. This can help the swelling go down.  Wear elastic bandages or support stockings as told by your doctor.  Eat a low-salt (low-sodium) diet to reduce fluid as told by your doctor.  Depending on the cause of your swelling, you may need to limit how much fluid you drink (fluid restriction).  Take over-the-counter and prescription medicines only as told by your doctor. Contact a doctor if:  Treatment is not working.  You have heart, liver, or kidney disease and have symptoms of edema.  You have sudden and unexplained weight gain. Get help  right away if:  You have shortness of breath or chest pain.  You cannot breathe when you lie down.  You have pain, redness, or warmth in the swollen areas.  You have heart, liver, or kidney disease and get edema all of a sudden.  You have a fever and your symptoms get worse all of a sudden. Summary  Edema is when you have too much fluid in your body or under your skin.  Edema may make your legs, feet, and ankles swell up. Swelling is also common in looser tissues, like around your eyes.  Raise (elevate) the swollen body part above the level of your heart when you are sitting or lying down.  Follow your doctor's instructions about diet and how much fluid you can drink (fluid restriction). This information is not intended to replace advice given to you by your health care provider. Make sure you discuss any questions you have with your health care provider. Document Revised: 06/16/2017 Document Reviewed: 07/01/2016 Elsevier Patient Education  2020 [employer].

## 2020-04-27 NOTE — Addendum Note (Signed)
Addended by: Billey Chang on: 04/27/2020 11:48 AM   Modules accepted: Orders

## 2020-04-27 NOTE — Progress Notes (Signed)
Subjective  Chief Complaint  Patient presents with  . Annual Exam    fasting  . Gynecologic Exam  . Health Maintenance    flu shot given in office today    HPI: Evelyn Chase is a 49 y.o. female who presents to [facility] today for a Female Wellness Visit. She also has the concerns and/or needs as listed above in the chief complaint. These will be addressed in addition to the Health Maintenance Visit.   Wellness Visit: annual visit with health maintenance review and exam with Pap   Health maintenance: Due for Pap smear today.  Remote history of abnormal.  Mammogram up-to-date.  Immunizations up-to-date.  Due flu shot  Chronic disease f/u and/or acute problem visit: (deemed necessary to be done in addition to the wellness visit):  Complains of bilateral lower extremity edema.  Worse after a long day at work.  She sits for most of the day.  Only 1 feet and ankles.  Not painful.  She is on amlodipine.  She is on HCTZ as well.  Diet is fairly good she reports.  Tries to avoid high sodium.  Hypertension on multiple medications.  She reports home blood pressures are well controlled at 120s over low 80s.  No chest pain or shortness of breath.  Compliant with medication.  History of subclinical hyperthyroidism: She feels more tired but no other symptoms of hypothyroidism.  Oral contraceptive use for the last several decades.  Now 49 years old.  Would consider coming off birth control.  Inappropriate sinus tachycardia medications that has remained well controlled.  Cough variant asthma without any recent flares.  Is mildly active right now due to fall allergies.  Adenomatous polyp due for colonoscopy.  Patient has called the office to schedule.  Dr. Thana Farr  Assessment  1. Annual physical exam   2. Cervical cancer screening   3. Essential hypertension   4. Need for hepatitis C screening test   5. Subclinical hypothyroidism   6. Oral contraceptive use    7. Cough variant asthma   8. Adenomatous polyp   9. Inappropriate sinus tachycardia   10. Lower extremity edema   11. Need for immunization against influenza      Plan  Female Wellness Visit:  Age appropriate Health Maintenance and Prevention measures were discussed with patient. Included topics are cancer screening recommendations, ways to keep healthy (see AVS) including dietary and exercise recommendations, regular eye and dental care, use of seat belts, and avoidance of moderate alcohol use and tobacco use.  Cervical cancer screening done today.  Immunization: Flu.  Recommend colonoscopy.  BMI: discussed patient's BMI and encouraged positive lifestyle modifications to help get to or maintain a target BMI.  HM needs and immunizations were addressed and ordered. See below for orders. See HM and immunization section for updates.  Routine labs and screening tests ordered including cmp, cbc and lipids where appropriate.  Discussed recommendations regarding Vit D and calcium supplementation (see AVS)    Chronic disease management visit and/or acute problem visit:  Hypertension well-controlled by home readings.  Continue current medications.  Lower extremity edema on low-dose amlodipine.  Monitor.  Recommend low-sodium diet and increase water intake.  Elevate legs if possible.  If worsening, may stop amlodipine and/or add or change diuretic.  Check renal function.  Check liver function.  No symptoms of heart failure.  History of subclinical hypothyroidism: Recheck today.  Ensure she is not developing hypothyroidism.  This could  be contributing to edema.  Education given.  Cough variant asthma well controlled continue allergy and asthma medications.  Discussed risks and benefits of oral contraceptive use at age 36 and hypertensive patient.  At this point would recommend stopping.  Follow-up if becomes symptomatic with perimenopausal symptoms or irregular bleeding.   Follow up: 6  months for hypertension recheck Orders Placed This Encounter  Procedures  . Flu Vaccine QUAD 36+ mos IM  . CBC with Differential/Platelet  . COMPLETE METABOLIC PANEL WITH GFR  . Hepatitis C antibody  . Lipid panel  . TSH  . T4, free  . T3   No orders of the defined types were placed in this encounter.     Lifestyle: Body mass index is 31.11 kg/m. Wt Readings from Last 3 Encounters:  04/27/20 175 lb 9.6 oz (79.7 kg)  02/11/19 172 lb (78 kg)  12/19/18 171 lb 12.8 oz (77.9 kg)     Patient Active Problem List   Diagnosis Date Noted  . Adenomatous polyp 11/06/2017    Priority: High    Dr. Watt Climes, q 3 year colonoscopy surveillance   . Essential hypertension 02/28/2012    Priority: High  . GERD without esophagitis 06/11/2018    Priority: Medium  . Inappropriate sinus tachycardia 11/06/2017    Priority: Medium    Evaluated January 2019 by cards, Dr. Carlis Abbott, [facility]; nl echocardiogam. Started ivabradine to slow heart rate in addition to BB.   . Oral contraceptive use 11/06/2017    Priority: Medium  . Subclinical hypothyroidism 11/06/2017    Priority: Medium  . Chronic diarrhea 11/06/2017    Priority: Medium    ? Malabsorption. Requesting records from GI.   Marland Kitchen Obesity (BMI 30-39.9) 11/06/2017    Priority: Medium    Seeing a nutritionist 2019   . Cough variant asthma 07/06/2007    Priority: Medium    Allergies are trigger; reports tolerated high dose beta-blocker   . 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 Seasonal and perennial allergic rhinitis 07/06/2007    Priority: Low    Allergy vaccine 1:50,000 07/25/2007, 1:10 06/16/2008, [facility]     Health Maintenance  Topic Date Due  . Hepatitis C Screening  Never done  . PAP SMEAR-Modifier  02/24/2020  . MAMMOGRAM  01/26/2021  . TETANUS/TDAP  11/09/2022  . COLONOSCOPY  10/01/2025  . INFLUENZA VACCINE  Completed  .  COVID-19 Vaccine  Completed  . HIV Screening  Completed   Immunization History  Administered Date(s) Administered  . Influenza Split 03/27/2012, 03/27/2013, 04/06/2014, 03/28/2015  . Influenza, Seasonal, Injecte, Preservative Fre 03/28/2015, 03/31/2016  . Influenza,inj,Quad PF,6+ Mos 03/08/2018, 04/27/2020  . PFIZER SARS-COV-2 Vaccination 09/28/2019, 10/22/2019  . Pneumococcal Polysaccharide-23 02/24/2015, 04/28/2015  . Tdap 11/08/2012   We updated and reviewed the patient's past history in detail and it is documented below. Allergies: Patient is allergic to codeine. Past Medical History Patient  has a past medical history of Adenomatous polyp (11/06/2017), Allergic rhinitis, Asthma, Chronic diarrhea (11/06/2017), Chronic sinusitis with recurrent bronchitis, Hypertension, Obesity (BMI 30-39.9) (11/06/2017), Pneumonia, and Subclinical hypothyroidism (11/06/2017). Past Surgical History Patient  has a past surgical history that includes Breast enhancement surgery; Knee surgery; Tendon release; Shoulder surgery; Cholecystectomy (N/A, 07/22/2016); Finger surgery; and bilateral tempromandibular joint arthroplasty. Family History: Patient family history includes Arthritis in her father; Asthma in her father; COPD in her father; Dementia in her mother; Diabetes in her  father; Emphysema in her father; Heart attack in her father; Heart disease in her father; Hyperlipidemia in her father; Hypertension in her father; Lung cancer (age of onset: 48) in her mother; Tuberculosis in her father. Social History:  Patient  reports that she has never smoked. She has never used smokeless tobacco. She reports current alcohol use. She reports that she does not use drugs.  Review of Systems: Constitutional: negative for fever or malaise Ophthalmic: negative for photophobia, double vision or loss of vision Cardiovascular: negative for chest pain, dyspnea on exertion, or new LE swelling Respiratory: negative for SOB or  persistent cough Gastrointestinal: negative for abdominal pain, change in bowel habits or melena Genitourinary: negative for dysuria or gross hematuria, no abnormal uterine bleeding or disharge Musculoskeletal: negative for new gait disturbance or muscular weakness Integumentary: negative for new or persistent rashes, no breast lumps Neurological: negative for TIA or stroke symptoms Psychiatric: negative for SI or delusions Allergic/Immunologic: negative for hives  Patient Care Team    Relationship Specialty Notifications Start End  Leamon Arnt, MD PCP - General Family Medicine  11/06/17   Clarene Essex, MD Consulting Physician Gastroenterology  11/06/17   Lorenda Cahill, DMD  Dentistry  11/06/17   Rhea Belton, MD Consulting Physician Cardiology  02/18/19     Objective  Vitals: BP 136/90   Pulse 84   Temp 98.1 F (36.7 C) (Temporal)   Ht 5\' 3"  (1.6 m)   Wt 175 lb 9.6 oz (79.7 kg)   SpO2 98%   BMI 31.11 kg/m  General:  Well developed, well nourished, no acute distress  Psych:  Alert and orientedx3,normal mood and affect HEENT:  Normocephalic, atraumatic, non-icteric sclera,  supple neck without adenopathy, mass or thyromegaly Cardiovascular:  Normal S1, S2, RRR without gallop, rub or murmur Respiratory:  Good breath sounds bilaterally, CTAB with normal respiratory effort Gastrointestinal: normal bowel sounds, soft, non-tender, no noted masses. No HSM MSK: no deformities, contusions. Joints are without erythema or swelling.  Skin:  Warm, no rashes or suspicious lesions noted Neurologic:    Mental status is normal. CN 2-11 are normal. Gross motor and sensory exams are normal. Normal gait. No tremor Breast Exam: No mass, skin retraction or nipple discharge is appreciated in either breast. No axillary adenopathy. Fibrocystic changes are not noted bilateral implants present Pelvic Exam: Normal external genitalia, no vulvar or vaginal lesions present. Clear cervix w/o CMT.  Bimanual exam reveals a nontender fundus w/o masses, nl size. No adnexal masses present. No inguinal adenopathy. A PAP smear was performed.    Commons side effects, risks, benefits, and alternatives for medications and treatment plan prescribed today were discussed, and the patient expressed understanding of the given instructions. Patient is instructed to call or message via MyChart if he/she has any questions or concerns regarding our treatment plan. No barriers to understanding were identified. We discussed Red Flag symptoms and signs in detail. Patient expressed understanding regarding what to do in case of urgent or emergency type symptoms.   Medication list was reconciled, printed and provided to the patient in AVS. Patient instructions and summary information was reviewed with the patient as documented in the AVS. This note was prepared with assistance of Dragon voice recognition software. Occasional wrong-word or sound-a-like substitutions may have occurred due to the inherent limitations of voice recognition software  This visit occurred during the SARS-CoV-2 public health emergency.  Safety protocols were in place, including screening questions prior to the visit, additional usage of staff  PPE, and extensive cleaning of exam room while observing appropriate contact time as indicated for disinfecting solutions.

## 2020-04-28 LAB — CBC WITH DIFFERENTIAL/PLATELET
Absolute Monocytes: 570 cells/uL (ref 200–950)
Basophils Absolute: 62 cells/uL (ref 0–200)
Basophils Relative: 0.8 %
Eosinophils Absolute: 77 cells/uL (ref 15–500)
Eosinophils Relative: 1 %
HCT: 42.2 % (ref 35.0–45.0)
Hemoglobin: 14.4 g/dL (ref 11.7–15.5)
Lymphs Abs: 2041 cells/uL (ref 850–3900)
MCH: 33.8 pg — ABNORMAL HIGH (ref 27.0–33.0)
MCHC: 34.1 g/dL (ref 32.0–36.0)
MCV: 99.1 fL (ref 80.0–100.0)
MPV: 13 fL — ABNORMAL HIGH (ref 7.5–12.5)
Monocytes Relative: 7.4 %
Neutro Abs: 4951 cells/uL (ref 1500–7800)
Neutrophils Relative %: 64.3 %
Platelets: 226 10*3/uL (ref 140–400)
RBC: 4.26 10*6/uL (ref 3.80–5.10)
RDW: 12 % (ref 11.0–15.0)
Total Lymphocyte: 26.5 %
WBC: 7.7 10*3/uL (ref 3.8–10.8)

## 2020-04-28 LAB — TSH: TSH: 4.11 mIU/L

## 2020-04-28 LAB — COMPLETE METABOLIC PANEL WITH GFR
AG Ratio: 1.6 (calc) (ref 1.0–2.5)
ALT: 54 U/L — ABNORMAL HIGH (ref 6–29)
AST: 65 U/L — ABNORMAL HIGH (ref 10–35)
Albumin: 4.5 g/dL (ref 3.6–5.1)
Alkaline phosphatase (APISO): 114 U/L (ref 37–153)
BUN: 17 mg/dL (ref 7–25)
CO2: 23 mmol/L (ref 20–32)
Calcium: 9.9 mg/dL (ref 8.6–10.4)
Chloride: 99 mmol/L (ref 98–110)
Creat: 0.91 mg/dL (ref 0.50–1.05)
GFR, Est African American: 85 mL/min/{1.73_m2} (ref >=60)
GFR, Est Non African American: 74 mL/min/{1.73_m2} (ref >=60)
Globulin: 2.9 g/dL (calc) (ref 1.9–3.7)
Glucose, Bld: 91 mg/dL (ref 65–99)
Potassium: 4 mmol/L (ref 3.5–5.3)
Sodium: 137 mmol/L (ref 135–146)
Total Bilirubin: 0.4 mg/dL (ref 0.2–1.2)
Total Protein: 7.4 g/dL (ref 6.1–8.1)

## 2020-04-28 LAB — HEPATITIS C ANTIBODY
Hepatitis C Ab: NONREACTIVE
SIGNAL TO CUT-OFF: 0.01 (ref <1.00)

## 2020-04-28 LAB — T4, FREE: Free T4: 1.1 ng/dL (ref 0.8–1.8)

## 2020-04-28 LAB — LIPID PANEL
Cholesterol: 202 mg/dL — ABNORMAL HIGH (ref <200)
HDL: 109 mg/dL (ref >=50)
LDL Cholesterol (Calc): 69 mg/dL (calc)
Non-HDL Cholesterol (Calc): 93 mg/dL (calc) (ref <130)
Total CHOL/HDL Ratio: 1.9 (calc) (ref <5.0)
Triglycerides: 165 mg/dL — ABNORMAL HIGH (ref <150)

## 2020-04-28 LAB — T3: T3, Total: 136 ng/dL (ref 76–181)

## 2020-04-29 LAB — CYTOLOGY - PAP
Comment: NEGATIVE
Diagnosis: NEGATIVE
High risk HPV: NEGATIVE

## 2020-05-21 ENCOUNTER — Other Ambulatory Visit: Payer: Self-pay | Admitting: Family Medicine

## 2020-08-10 ENCOUNTER — Encounter: Payer: Self-pay | Admitting: Family Medicine

## 2020-08-12 DIAGNOSIS — Z20822 Contact with and (suspected) exposure to covid-19: Secondary | ICD-10-CM | POA: Diagnosis not present

## 2020-08-14 ENCOUNTER — Other Ambulatory Visit: Payer: Self-pay

## 2020-08-14 ENCOUNTER — Telehealth (INDEPENDENT_AMBULATORY_CARE_PROVIDER_SITE_OTHER): Payer: No Typology Code available for payment source | Admitting: Family Medicine

## 2020-08-14 ENCOUNTER — Encounter: Payer: Self-pay | Admitting: Family Medicine

## 2020-08-14 DIAGNOSIS — Z20822 Contact with and (suspected) exposure to covid-19: Secondary | ICD-10-CM | POA: Diagnosis not present

## 2020-08-14 DIAGNOSIS — J45991 Cough variant asthma: Secondary | ICD-10-CM

## 2020-08-14 MED ORDER — GUAIFENESIN-CODEINE 100-10 MG/5ML PO SOLN
5.0000 mL | Freq: Four times a day (QID) | ORAL | 0 refills | Status: DC | PRN
Start: 2020-08-14 — End: 2020-10-26

## 2020-08-14 MED ORDER — PREDNISONE 10 MG PO TABS: ORAL_TABLET | ORAL | 0 refills | Status: DC

## 2020-08-14 NOTE — Progress Notes (Signed)
Virtual Visit via Video Note  Subjective  CC:  Chief Complaint  Patient presents with  . Cough    Started early 2/13, dry cough, chills, headache, sinus congestion, fever. Has been tested for COVID - waiting on results     I connected with Evelyn Chase on 08/14/20 at  4:00 PM EST by a video enabled telemedicine application and verified that I am speaking with the correct person using two identifiers. Location patient: Home Location provider: [facility] [place], Office Persons participating in the virtual visit: Evelyn Chase, Evelyn Arnt, MD Reymundo Poll CMA  I discussed the limitations of evaluation and management by telemedicine and the availability of in person appointments. The patient expressed understanding and agreed to proceed. HPI: Evelyn Chase is a 50 y.o. female who was contacted today to address the problems listed above in the chief complaint. . 50 year old female with asthma presents due to 5-day history of Covid-like symptoms: She awoke with sore throat.  Since has had low-grade fevers, congestion, body aches, headaches, fatigue and yesterday started with barking hacking cough.  Cough is dry nonproductive.  She has had no loss of taste or smell.  She does not feel short of breath.  She currently is not wheezing although she is worried that asthma may start to be a problem since the cough is worsening.  She is vaccinated but has not yet had her booster for Covid.  She has had a known exposure at work last week.  She was tested on day 4 of illness.  Her PCR test results are pending.  She has no sinus pain.  No GI symptoms.  Appetite is down but she is able to drink plenty of fluids.  She is resting but now the cough is becoming interfering.  She has a history of persistent cough after asthma exacerbations.   Assessment  1. Suspected COVID-19 virus infection   2. Cough variant asthma      Plan   Suspected Covid infection  in a patient with asthma: Respiratory status is currently stable.  Start Robitussin-AC for cough.  Patient tolerates codeine and can premedicate with Benadryl if needed.  Codeine just causes itching or true allergy.  Also start prednisone taper given cough variant asthma.  Await test results.  No signs of serious bacterial infection at this time.  Follow-up if worsens. I discussed the assessment and treatment plan with the patient. The patient was provided an opportunity to ask questions and all were answered. The patient agreed with the plan and demonstrated an understanding of the instructions.   The patient was advised to call back or seek an in-person evaluation if the symptoms worsen or if the condition fails to improve as anticipated. Follow up: As scheduled 10/26/2020  Meds ordered this encounter  Medications  . guaiFENesin-codeine 100-10 MG/5ML syrup    Sig: Take 5 mLs by mouth every 6 (six) hours as needed for cough.    Dispense:  120 mL    Refill:  0  . predniSONE (DELTASONE) 10 MG tablet    Sig: Take 4 tabs qd x 2 days, 3 qd x 2 days, 2 qd x 2d, 1qd x 3 days    Dispense:  21 tablet    Refill:  0      I reviewed the patients updated PMH, FH, and SocHx.    Patient Active Problem List   Diagnosis Date Noted  . Adenomatous polyp 11/06/2017  Priority: High  . Essential hypertension 02/28/2012    Priority: High  . GERD without esophagitis 06/11/2018    Priority: Medium  . Inappropriate sinus tachycardia 11/06/2017    Priority: Medium  . Oral contraceptive use 11/06/2017    Priority: Medium  . Subclinical hypothyroidism 11/06/2017    Priority: Medium  . Chronic diarrhea 11/06/2017    Priority: Medium  . Obesity (BMI 30-39.9) 11/06/2017    Priority: Medium  . Cough variant asthma 07/06/2007    Priority: Medium  . Bilateral low frequency hearing loss 11/06/2017    Priority: Low  . Seasonal and perennial allergic rhinitis 07/06/2007    Priority: Low   Current Meds   Medication Sig  . albuterol (PROAIR HFA) 108 (90 Base) MCG/ACT inhaler Inhale 2 puffs into the lungs 4 (four) times daily as needed.  Marland Kitchen amLODipine (NORVASC) 5 MG tablet Take 1 tablet (5 mg total) by mouth daily.  . Ascorbic Acid (VITAMIN C) 500 MG CAPS Take 1 tablet by mouth 2 (two) times daily.  . budesonide-formoterol (SYMBICORT) 160-4.5 MCG/ACT inhaler Inhale into the lungs.  . calcium carbonate (OS-CAL) 600 MG TABS tablet Take 600 mg by mouth daily.  . CORLANOR 5 MG TABS tablet TAKE 1 TABLET TWICE A DAY  WITH MEALS  . EPINEPHrine (EPI-PEN) 0.3 mg/0.3 mL SOAJ injection Inject into thigh for severe allergic reaction  . guaiFENesin-codeine 100-10 MG/5ML syrup Take 5 mLs by mouth every 6 (six) hours as needed for cough.  . losartan-hydrochlorothiazide (HYZAAR) 100-25 MG tablet Take 1 tablet by mouth daily.  . metoprolol (TOPROL-XL) 200 MG 24 hr tablet TAKE 1 TABLET DAILY  . Multiple Vitamin (MULTIVITAMIN) tablet Take 1 tablet by mouth daily.  Marland Kitchen omeprazole (PRILOSEC) 20 MG capsule TAKE 1 CAPSULE DAILY;      OFFICE VISIT NEEDED  . predniSONE (DELTASONE) 10 MG tablet Take 4 tabs qd x 2 days, 3 qd x 2 days, 2 qd x 2d, 1qd x 3 days    Allergies: Patient is allergic to codeine. Family History: Patient family history includes Arthritis in her father; Asthma in her father; COPD in her father; Dementia in her mother; Diabetes in her father; Emphysema in her father; Heart attack in her father; Heart disease in her father; Hyperlipidemia in her father; Hypertension in her father; Lung cancer (age of onset: 74) in her mother; Tuberculosis in her father. Social History:  Patient  reports that she has never smoked. She has never used smokeless tobacco. She reports current alcohol use. She reports that she does not use drugs.  Review of Systems: Constitutional: Negative for fever malaise or anorexia Cardiovascular: negative for chest pain Respiratory: negative for SOB or persistent  cough Gastrointestinal: negative for abdominal pain  OBJECTIVE Vitals: There were no vitals taken for this visit.  T-max 100.1 General: no acute distress , A&Ox3, no respiratory distress Coughing Evelyn Arnt, MD

## 2020-09-02 ENCOUNTER — Encounter: Payer: Self-pay | Admitting: Family Medicine

## 2020-09-03 ENCOUNTER — Other Ambulatory Visit: Payer: Self-pay

## 2020-09-03 MED ORDER — LOSARTAN POTASSIUM-HCTZ 100-25 MG PO TABS
1.0000 | ORAL_TABLET | Freq: Every day | ORAL | 3 refills | Status: DC
Start: 2020-09-03 — End: 2021-09-15

## 2020-09-03 MED ORDER — ALBUTEROL SULFATE HFA 108 (90 BASE) MCG/ACT IN AERS: 2.0000 | INHALATION_SPRAY | Freq: Four times a day (QID) | RESPIRATORY_TRACT | 2 refills | Status: AC | PRN

## 2020-09-03 MED ORDER — IVABRADINE HCL 5 MG PO TABS
5.0000 mg | ORAL_TABLET | Freq: Two times a day (BID) | ORAL | 3 refills | Status: DC
Start: 2020-09-03 — End: 2021-09-15

## 2020-09-03 MED ORDER — AMLODIPINE BESYLATE 5 MG PO TABS
5.0000 mg | ORAL_TABLET | Freq: Every day | ORAL | 3 refills | Status: DC
Start: 2020-09-03 — End: 2020-10-26

## 2020-09-03 MED ORDER — METOPROLOL SUCCINATE ER 200 MG PO TB24: 200.0000 mg | ORAL_TABLET | Freq: Every day | ORAL | 3 refills | Status: DC

## 2020-09-03 MED ORDER — OMEPRAZOLE 20 MG PO CPDR: DELAYED_RELEASE_CAPSULE | ORAL | 3 refills | Status: DC

## 2020-10-06 ENCOUNTER — Encounter: Payer: Self-pay | Admitting: Family Medicine

## 2020-10-26 ENCOUNTER — Other Ambulatory Visit: Payer: Self-pay

## 2020-10-26 ENCOUNTER — Ambulatory Visit (INDEPENDENT_AMBULATORY_CARE_PROVIDER_SITE_OTHER): Payer: 59 | Admitting: Family Medicine

## 2020-10-26 ENCOUNTER — Encounter: Payer: Self-pay | Admitting: Family Medicine

## 2020-10-26 VITALS — BP 140/92 | HR 90 | Temp 98.1°F | Resp 16 | Ht 63.0 in | Wt 174.8 lb

## 2020-10-26 DIAGNOSIS — R7989 Other specified abnormal findings of blood chemistry: Secondary | ICD-10-CM

## 2020-10-26 DIAGNOSIS — I10 Essential (primary) hypertension: Secondary | ICD-10-CM | POA: Diagnosis not present

## 2020-10-26 DIAGNOSIS — R000 Tachycardia, unspecified: Secondary | ICD-10-CM | POA: Diagnosis not present

## 2020-10-26 LAB — COMPREHENSIVE METABOLIC PANEL
ALT: 69 U/L — ABNORMAL HIGH (ref 0–35)
AST: 69 U/L — ABNORMAL HIGH (ref 0–37)
Albumin: 4.5 g/dL (ref 3.5–5.2)
Alkaline Phosphatase: 160 U/L — ABNORMAL HIGH (ref 39–117)
BUN: 11 mg/dL (ref 6–23)
CO2: 27 mEq/L (ref 19–32)
Calcium: 9.9 mg/dL (ref 8.4–10.5)
Chloride: 102 mEq/L (ref 96–112)
Creatinine, Ser: 0.82 mg/dL (ref 0.40–1.20)
GFR: 82.94 mL/min (ref >60.00)
Glucose, Bld: 100 mg/dL — ABNORMAL HIGH (ref 70–99)
Potassium: 4.3 mEq/L (ref 3.5–5.1)
Sodium: 142 mEq/L (ref 135–145)
Total Bilirubin: 0.6 mg/dL (ref 0.2–1.2)
Total Protein: 7.4 g/dL (ref 6.0–8.3)

## 2020-10-26 NOTE — Progress Notes (Signed)
Subjective  CC:  Chief Complaint  Patient presents with  . Hypertension    Home readings between 110/73-161/94.     HPI: Evelyn Chase is a 50 y.o. female who presents to the office today to address the problems listed above in the chief complaint.  Hypertension f/u: Control is fair .  There has been some confusion with her medication.  Several weeks ago she was having low blood pressures.  She had some vomiting.  We held lisinopril HCTZ and she has not taken it since.  However, she never restarted amlodipine 5 mg daily either.  Her blood pressures are not running too high.  However over the last week, they are averaging 140s over 90s.  Pt reports she is doing well. taking medications as instructed, no medication side effects noted, no TIAs, no chest pain on exertion, no dyspnea on exertion, no swelling of ankles.  We confirmed from a pharmacy that she did not pick up the amlodipine in March.  She denies adverse effects from his BP medications.   In November, she had mildly elevated liver test.  She denies regular alcohol use or Tylenol use.  No history of fatty liver or elevated test in the past.  Has had a negative hepatitis C antibody test recently.  No jaundice, right upper quadrant pain.  Remains on a beta-blocker and HCB blocker for her inappropriate sinus tachycardia which remains well controlled  Assessment  1. Essential hypertension   2. Elevated liver function tests   3. Inappropriate sinus tachycardia      Plan    Hypertension f/u: BP control is fairly well controlled.  We will restart losartan HCTZ in addition to metoprolol and ivabradine.  She will continue home monitoring and let me know if her blood pressure is running too low.  Check renal function.  Elevated liver test f/u: She is asymptomatic.  Elevations were mild.  Recheck today along with some screening labs.  Ultrasound if remains abnormal.  Patient stands and agrees.  Inappropriate sinus tachycardia  well-controlled.  Continue medications   Education regarding management of these chronic disease states was given. Management strategies discussed on successive visits include dietary and exercise recommendations, goals of achieving and maintaining IBW, and lifestyle modifications aiming for adequate sleep and minimizing stressors.   Follow up: 6 months for complete physical  Orders Placed This Encounter  Procedures  . Comprehensive metabolic panel  . Hepatitis B surface antigen  . Hepatitis B Core Antibody, total  . Antinuclear Antib (ANA)  . Mitochondrial Antibodies  . Anti-smooth muscle antibody, IgG   No orders of the defined types were placed in this encounter.     BP Readings from Last 3 Encounters:  10/26/20 (!) 140/92  04/27/20 136/90  02/11/19 110/68   Wt Readings from Last 3 Encounters:  10/26/20 174 lb 12.8 oz (79.3 kg)  04/27/20 175 lb 9.6 oz (79.7 kg)  02/11/19 172 lb (78 kg)    Lab Results  Component Value Date   CHOL 202 (H) 04/27/2020   CHOL 180 02/11/2019   CHOL 173 12/13/2017   Lab Results  Component Value Date   HDL 109 04/27/2020   HDL 91.60 02/11/2019   HDL 88.70 12/13/2017   Lab Results  Component Value Date   LDLCALC 69 04/27/2020   LDLCALC 53 02/11/2019   LDLCALC 50 12/13/2017   Lab Results  Component Value Date   TRIG 165 (H) 04/27/2020   TRIG 175.0 (H) 02/11/2019   TRIG 176.0 (  H) 12/13/2017   Lab Results  Component Value Date   CHOLHDL 1.9 04/27/2020   CHOLHDL 2 02/11/2019   CHOLHDL 2 12/13/2017   No results found for: LDLDIRECT Lab Results  Component Value Date   CREATININE 0.91 04/27/2020   BUN 17 04/27/2020   NA 137 04/27/2020   K 4.0 04/27/2020   CL 99 04/27/2020   CO2 23 04/27/2020    The ASCVD Risk score (Goff DC Jr., et al., 2013) failed to calculate for the following reasons:   The valid HDL cholesterol range is 20 to 100 mg/dL  I reviewed the patients updated PMH, FH, and SocHx.    Patient Active Problem  List   Diagnosis Date Noted  . Adenomatous polyp 11/06/2017    Priority: High  . Essential hypertension 02/28/2012    Priority: High  . GERD without esophagitis 06/11/2018    Priority: Medium  . Inappropriate sinus tachycardia 11/06/2017    Priority: Medium  . Subclinical hypothyroidism 11/06/2017    Priority: Medium  . Chronic diarrhea 11/06/2017    Priority: Medium  . Obesity (BMI 30-39.9) 11/06/2017    Priority: Medium  . Cough variant asthma 07/06/2007    Priority: Medium  . Bilateral low frequency hearing loss 11/06/2017    Priority: Low  . Seasonal and perennial allergic rhinitis 07/06/2007    Priority: Low    Allergies: Codeine  Social History: Patient  reports that she has never smoked. She has never used smokeless tobacco. She reports current alcohol use. She reports that she does not use drugs.  Current Meds  Medication Sig  . albuterol (PROAIR HFA) 108 (90 Base) MCG/ACT inhaler Inhale 2 puffs into the lungs 4 (four) times daily as needed.  . Ascorbic Acid (VITAMIN C) 500 MG CAPS Take 1 tablet by mouth 2 (two) times daily.  . budesonide-formoterol (SYMBICORT) 160-4.5 MCG/ACT inhaler Inhale into the lungs.  . calcium carbonate (OS-CAL) 600 MG TABS tablet Take 600 mg by mouth daily.  Marland Kitchen EPINEPHrine (EPI-PEN) 0.3 mg/0.3 mL SOAJ injection Inject into thigh for severe allergic reaction  . ivabradine (CORLANOR) 5 MG TABS tablet Take 1 tablet (5 mg total) by mouth 2 (two) times daily with a meal.  . metoprolol (TOPROL-XL) 200 MG 24 hr tablet Take 1 tablet (200 mg total) by mouth daily.  . Multiple Vitamin (MULTIVITAMIN) tablet Take 1 tablet by mouth daily.  Marland Kitchen omeprazole (PRILOSEC) 20 MG capsule TAKE 1 CAPSULE DAILY;      OFFICE VISIT NEEDED  . [DISCONTINUED] amLODipine (NORVASC) 5 MG tablet Take 1 tablet (5 mg total) by mouth daily.    Review of Systems: Cardiovascular: negative for chest pain, palpitations, leg swelling, orthopnea Respiratory: negative for SOB,  wheezing or persistent cough Gastrointestinal: negative for abdominal pain Genitourinary: negative for dysuria or gross hematuria  Objective  Vitals: BP (!) 140/92   Pulse 90   Temp 98.1 F (36.7 C) (Temporal)   Resp 16   Ht 5\' 3"  (1.6 m)   Wt 174 lb 12.8 oz (79.3 kg)   SpO2 98%   BMI 30.96 kg/m  General: no acute distress  Psych:  Alert and oriented, normal mood and affect HEENT:  Normocephalic, atraumatic, supple neck  Cardiovascular:  RRR without murmur. no edema Respiratory:  Good breath sounds bilaterally, CTAB with normal respiratory effort Gastrointestinal: soft, flat abdomen, normal active bowel sounds, no palpable masses, no hepatosplenomegaly, no appreciated hernias Skin:  Warm, no rashes Neurologic:   Mental status is normal  Commons side  effects, risks, benefits, and alternatives for medications and treatment plan prescribed today were discussed, and the patient expressed understanding of the given instructions. Patient is instructed to call or message via MyChart if he/she has any questions or concerns regarding our treatment plan. No barriers to understanding were identified. We discussed Red Flag symptoms and signs in detail. Patient expressed understanding regarding what to do in case of urgent or emergency type symptoms.   Medication list was reconciled, printed and provided to the patient in AVS. Patient instructions and summary information was reviewed with the patient as documented in the AVS. This note was prepared with assistance of Dragon voice recognition software. Occasional wrong-word or sound-a-like substitutions may have occurred due to the inherent limitations of voice recognition software  This visit occurred during the SARS-CoV-2 public health emergency.  Safety protocols were in place, including screening questions prior to the visit, additional usage of staff PPE, and extensive cleaning of exam room while observing appropriate contact time as indicated  for disinfecting solutions.

## 2020-10-26 NOTE — Patient Instructions (Signed)
Please return in 6 months for your annual complete physical; please come fasting. Sooner if your blood pressures start to run high or low again.  Please take the metroprol, ivabradine, and the losartan/hctz as directed.  Continue to monitor your blood pressures at home while restarting the losartan hctz. We want them 100-120s/60-70s but not lower.   I will release your lab results to you on your MyChart account with further instructions. Please reply with any questions.  If your liver tests remain elevated, stop all alcohol and tylenol and I will order an ultrasound for further assessment.   If you have any questions or concerns, please don't hesitate to send me a message via MyChart or call the office at [phone]. Thank you for visiting with [place] today! It's our pleasure caring for you.   Liver Function Tests Why am I having this test? Liver function tests are done to see how well your liver is working. The proteins and enzymes measured in the tests can alert your health care provider to inflammation, damage, or disease in your liver. It is common to have liver function tests:  When you are taking certain medicines.  If you have liver disease.  If you drink a lot of alcohol.  During annual physical exams.  When you have other conditions that may affect your liver.  If you have symptoms such as yellowing of the skin (jaundice), abdominal pain, or nausea and vomiting. What is being tested? These tests measure various substances in your blood. This may include:  Alanine aminotransferase (ALT). This is an enzyme in the liver.  Aspartate aminotransferase (AST). This is an enzyme in the liver, heart, and muscles.  Alkaline phosphatase (ALP). This is a protein in the liver, bile ducts, bone, and other body tissues.  Total bilirubin. This is a yellow pigment in bile.  Albumin. This is a protein in the liver.  Prothrombin time and international normalized ratio (PT and INR). PT  measures the time it takes for your blood to clot. INR is a calculation of blood clotting time based on your PT result.  Total protein. This includes two proteins, albumin and globulin, found in the blood. What kind of sample is taken? A blood sample is required for this test. It is usually collected by inserting a needle into a blood vessel.   How do I prepare for this test? How you prepare will depend on which tests are being done and the reason for doing them. You may need to:  Avoid eating for 4-6 hours before the test, or as told by your health care provider. Follow instructions from your health care provider about eating or drinking restrictions before the tests.  Stop taking certain medicines before your blood test, as told by your health care provider. Tell a health care provider about:  All medicines you are taking, including vitamins, herbs, eye drops, creams, and over-the-counter medicines.  Any medical conditions you have.  Whether you are pregnant or may be pregnant. How are the results reported? Your test results will be reported as values. Your health care provider will compare your results to normal ranges that were established after testing a large group of people (reference ranges). Reference ranges may vary among labs and hospitals. For the substances measured in liver function tests, common reference ranges are: ALT  Infant: 10-40 international units/L.  Child or adult: 4-36 international units/L at 37C or 4-36 units/L (SI units).  Reference ranges may be higher for older adults. AST  Newborn 87-62 days old: 35-140 units/L.  Child younger than 47 years old: 15-60 units/L.  57-46 years old: 15-50 units/L.  41-110 years old: 10-50 units/L.  60-90 years old: 10-40 units/L.  Adult: 0-35 units/L or 0-0.58 microkatals/L (SI units).  Reference ranges may be higher for older adults. ALP  Child younger than 36 years old: 85-235 units/L.  53-67 years old: 65-210  units/L.  35-19 years old: 60-300 units/L.  9-53 years old: 30-200 units/L.  Adult: 30-120 units/L or 0.5-2.0 microkatals/L (SI units).  Reference ranges may be higher for older adults. Total bilirubin  Newborn: 1.0-12.0 mg/dL or 17.1-205 micromoles/L (SI units).  Child or adult: 0.3-1.0 mg/dL or 5.1-17 micromoles/L. Albumin  Premature infant: 3.0-4.2 g/dL.  Newborn: 3.5-5.4 g/dL.  Infant: 4.4-5.4 g/dL.  Child: 4.0-5.9 g/dL.  Adult: 3.5-5.0 g/dL or 35-50 g/L (SI units). PT  11.0-12.5 seconds; 85%-100%. INR  0.8-1.1. Total protein  Premature infant: 4.2-7.6 g/dL.  Newborn: 4.6-7.4 g/dL.  Infant: 6.0-6.7 g/dL.  Child: 6.2-8.0 g/dL.  Adult: 6.4-8.3 g/dL or 64-83 g/L (SI units). What do the results mean? Results that are within the reference ranges are considered normal. For each substance measured, results outside the reference range can indicate various health issues. ALT  Levels above the normal range may indicate liver disease. Sometimes levels also increase after burns, surgery, heart attack, muscle damage, or seizure. AST  Levels above the normal range may indicate liver disease, skeletal muscle diseases, or other diseases that destroy the red blood cells or tissues of the pancreas.  Levels below the normal range may indicate acute kidney disease, pregnancy, or diabetic ketoacidosis. ALP  Levels above the normal range may be seen in biliary obstruction, liver diseases, bone disease, thyroid disease, lymphoma, or several other conditions. People with blood type O or B may show higher levels after a fatty meal.  Levels below the normal range may indicate bone and teeth conditions, malnutrition, protein deficiency, or Wilson's disease. Total bilirubin  Levels above the normal range may indicate problems with the liver, gallbladder, or bile ducts. Albumin  Levels above the normal range may indicate dehydration. They may also be caused by a diet that is high  in protein.  Levels below the normal range may indicate kidney disease, liver disease, or malabsorption of nutrients. PT and INR  Levels above the normal range mean that your blood is clotting slower than normal. This may be due to blood disorders, liver disorders, certain medicines like warfarin, or low levels of vitamin K. Total protein  Levels above the normal range may be due to infection or other diseases.  Levels below the normal range may be due to an immune system disorder, bleeding, burns, kidney disorder, liver disease, trouble absorbing or getting nutrients, or other conditions that affect the intestines. Talk with your health care provider about what your results mean. Questions to ask your health care provider Ask your health care provider, or the department that is doing the test:  When will my results be ready?  How will I get my results?  What are my treatment options?  What other tests do I need?  What are my next steps? Summary  Liver function tests are done to see how well your liver is working.  The results can alert your health care provider to inflammation, damage, or disease in your liver.  You may need to avoid eating for 4-6 hours before the test or stop taking certain medicines before your blood test, as told by your health  care provider.  Talk with your health care provider about what your results mean. This information is not intended to replace advice given to you by your health care provider. Make sure you discuss any questions you have with your health care provider. Document Revised: 03/17/2020 Document Reviewed: 03/17/2020 Elsevier Patient Education  2021 [employer].

## 2020-10-29 LAB — ANA: Anti Nuclear Antibody (ANA): POSITIVE — AB

## 2020-10-29 LAB — ANTI-SMOOTH MUSCLE ANTIBODY, IGG: Actin (Smooth Muscle) Antibody (IGG): <20 U (ref <20)

## 2020-10-29 LAB — HEPATITIS B CORE ANTIBODY, TOTAL: Hep B Core Total Ab: NONREACTIVE

## 2020-10-29 LAB — MITOCHONDRIAL ANTIBODIES: Mitochondrial M2 Ab, IgG: 21.5 U — ABNORMAL HIGH

## 2020-10-29 LAB — ANTI-NUCLEAR AB-TITER (ANA TITER): ANA Titer 1: 1:80 {titer} — ABNORMAL HIGH

## 2020-10-29 LAB — HEPATITIS B SURFACE ANTIGEN: Hepatitis B Surface Ag: NONREACTIVE

## 2020-10-30 ENCOUNTER — Other Ambulatory Visit: Payer: Self-pay

## 2020-10-30 DIAGNOSIS — R7989 Other specified abnormal findings of blood chemistry: Secondary | ICD-10-CM

## 2020-11-02 ENCOUNTER — Encounter: Payer: Self-pay | Admitting: Family Medicine

## 2020-11-05 ENCOUNTER — Telehealth: Payer: Self-pay

## 2020-11-05 NOTE — Telephone Encounter (Signed)
Spoke with patient regarding lab results, let her know that I ordered the ultrasound 10/30/2020. Gave a verbal understanding. Will let us know if she does not receive a call in 2 weeks for the ultrasound.

## 2020-12-10 ENCOUNTER — Other Ambulatory Visit: Payer: 59

## 2020-12-22 ENCOUNTER — Other Ambulatory Visit: Payer: 59

## 2021-01-04 ENCOUNTER — Ambulatory Visit
Admission: RE | Admit: 2021-01-04 | Discharge: 2021-01-04 | Disposition: A | Discharge status: Home / Self Care | Payer: 59 | Source: Ambulatory Visit | Attending: Family Medicine | Admitting: Family Medicine

## 2021-01-04 DIAGNOSIS — K760 Fatty (change of) liver, not elsewhere classified: Secondary | ICD-10-CM | POA: Diagnosis not present

## 2021-01-04 DIAGNOSIS — R7989 Other specified abnormal findings of blood chemistry: Secondary | ICD-10-CM

## 2021-01-05 ENCOUNTER — Encounter: Payer: Self-pay | Admitting: Family Medicine

## 2021-01-05 DIAGNOSIS — K760 Fatty (change of) liver, not elsewhere classified: Secondary | ICD-10-CM

## 2021-01-05 HISTORY — DX: Fatty (change of) liver, not elsewhere classified: K76.0

## 2021-01-27 DIAGNOSIS — Z20822 Contact with and (suspected) exposure to covid-19: Secondary | ICD-10-CM | POA: Diagnosis not present

## 2021-04-13 DIAGNOSIS — Z1231 Encounter for screening mammogram for malignant neoplasm of breast: Secondary | ICD-10-CM | POA: Diagnosis not present

## 2021-04-13 LAB — HM MAMMOGRAPHY

## 2021-04-15 ENCOUNTER — Encounter: Payer: Self-pay | Admitting: Family Medicine

## 2021-08-02 DIAGNOSIS — K2100 Gastro-esophageal reflux disease with esophagitis, without bleeding: Secondary | ICD-10-CM | POA: Diagnosis not present

## 2021-08-02 DIAGNOSIS — Z86010 Personal history of colonic polyps: Secondary | ICD-10-CM | POA: Diagnosis not present

## 2021-08-02 DIAGNOSIS — R198 Other specified symptoms and signs involving the digestive system and abdomen: Secondary | ICD-10-CM | POA: Diagnosis not present

## 2021-08-03 ENCOUNTER — Ambulatory Visit
Admission: RE | Admit: 2021-08-03 | Discharge: 2021-08-03 | Disposition: A | Discharge status: Home / Self Care | Payer: 59 | Source: Ambulatory Visit | Attending: Gastroenterology | Admitting: Gastroenterology

## 2021-08-03 ENCOUNTER — Other Ambulatory Visit: Payer: Self-pay | Admitting: Gastroenterology

## 2021-08-03 DIAGNOSIS — K5900 Constipation, unspecified: Secondary | ICD-10-CM | POA: Diagnosis not present

## 2021-08-03 DIAGNOSIS — R198 Other specified symptoms and signs involving the digestive system and abdomen: Secondary | ICD-10-CM

## 2021-08-04 DIAGNOSIS — K449 Diaphragmatic hernia without obstruction or gangrene: Secondary | ICD-10-CM | POA: Diagnosis not present

## 2021-08-04 DIAGNOSIS — Z86010 Personal history of colonic polyps: Secondary | ICD-10-CM | POA: Diagnosis not present

## 2021-08-04 DIAGNOSIS — K219 Gastro-esophageal reflux disease without esophagitis: Secondary | ICD-10-CM | POA: Diagnosis not present

## 2021-08-04 DIAGNOSIS — R197 Diarrhea, unspecified: Secondary | ICD-10-CM | POA: Diagnosis not present

## 2021-08-04 DIAGNOSIS — K649 Unspecified hemorrhoids: Secondary | ICD-10-CM | POA: Diagnosis not present

## 2021-08-04 LAB — HM COLONOSCOPY

## 2021-08-13 DIAGNOSIS — K219 Gastro-esophageal reflux disease without esophagitis: Secondary | ICD-10-CM | POA: Diagnosis not present

## 2021-08-20 ENCOUNTER — Encounter: Payer: Self-pay | Admitting: Family Medicine

## 2021-09-12 ENCOUNTER — Other Ambulatory Visit: Payer: Self-pay | Admitting: Family Medicine

## 2021-09-15 ENCOUNTER — Encounter: Payer: Self-pay | Admitting: Family Medicine

## 2021-09-15 ENCOUNTER — Ambulatory Visit: Payer: No Typology Code available for payment source | Admitting: Family Medicine

## 2021-09-15 ENCOUNTER — Other Ambulatory Visit: Payer: Self-pay | Admitting: Family Medicine

## 2021-09-15 VITALS — BP 130/87 | HR 91 | Temp 99.8°F | Ht 63.0 in | Wt 160.2 lb

## 2021-09-15 DIAGNOSIS — J45991 Cough variant asthma: Secondary | ICD-10-CM | POA: Diagnosis not present

## 2021-09-15 DIAGNOSIS — J208 Acute bronchitis due to other specified organisms: Secondary | ICD-10-CM

## 2021-09-15 DIAGNOSIS — K760 Fatty (change of) liver, not elsewhere classified: Secondary | ICD-10-CM

## 2021-09-15 DIAGNOSIS — B9689 Other specified bacterial agents as the cause of diseases classified elsewhere: Secondary | ICD-10-CM

## 2021-09-15 DIAGNOSIS — I10 Essential (primary) hypertension: Secondary | ICD-10-CM | POA: Diagnosis not present

## 2021-09-15 LAB — POC COVID19 BINAXNOW: SARS Coronavirus 2 Ag: NEGATIVE

## 2021-09-15 MED ORDER — GUAIFENESIN-CODEINE 100-10 MG/5ML PO SOLN: 5.0000 mL | Freq: Four times a day (QID) | ORAL | 0 refills | Status: DC | PRN

## 2021-09-15 MED ORDER — LOSARTAN POTASSIUM-HCTZ 100-25 MG PO TABS: 1.0000 | ORAL_TABLET | Freq: Every day | ORAL | 3 refills | Status: DC

## 2021-09-15 MED ORDER — AZITHROMYCIN 250 MG PO TABS: ORAL_TABLET | ORAL | 0 refills | Status: DC

## 2021-09-15 MED ORDER — IVABRADINE HCL 5 MG PO TABS: 5.0000 mg | ORAL_TABLET | Freq: Two times a day (BID) | ORAL | 3 refills | Status: DC

## 2021-09-15 MED ORDER — METOPROLOL SUCCINATE ER 200 MG PO TB24: 200.0000 mg | ORAL_TABLET | Freq: Every day | ORAL | 3 refills | Status: DC

## 2021-09-15 NOTE — Progress Notes (Signed)
poc

## 2021-09-15 NOTE — Progress Notes (Signed)
Subjective  CC:  Chief Complaint  Patient presents with   Cough   Sore Throat    Pt says that all symptoms started the beginning of this week. She has taken OTC Mucinex, but symptoms have not subsided.      Headache   Diarrhea    HPI: Evelyn Chase is a 51 y.o. female who presents to the office today to address the problems listed above in the chief complaint. 51 year old with cough variant asthma c/o bronchitis sxs x 4-5 days: scratchy throat, intermittently productive cough, headache, ear pressure and malaise with fever to 101 this am. Father and mother are sick with same sxs. Husband sought care yesterday and is now on abx with a negative covid test. Pt denies sob or wheezing. No n/v occ loose stool. No abdominal pain. No pleuritic chest pain. Fever responsive to tylenol.  Hypertension f/u: Control is good . Pt reports she is doing well. taking medications as instructed, no medication side effects noted, no TIAs, no chest pain on exertion, no dyspnea on exertion, no swelling of ankles. On 3 drug regimen. Overdue for labs. She denies adverse effects from his BP medications. Compliance with medication is good.  Elevated lfts with hepatic  steatosis. No pain.   Assessment  1. Acute bacterial bronchitis   2. Essential hypertension   3. Hepatic steatosis   4. Cough variant asthma      Plan  Bronchitis: discussed viral vs bacterial. Due to productive cough and h/o asthma and pt preference, zpak. Cough meds and monitor for wheezing Hypertension f/u: BP control is well controlled. Refilled bp meds. Check renal functin Recheck liver tests.   Education regarding management of these chronic disease states was given. Management strategies discussed on successive visits include dietary and exercise recommendations, goals of achieving and maintaining IBW, and lifestyle modifications aiming for adequate sleep and minimizing stressors.   Follow up: cpe in 3-4 months.  Orders Placed This  Encounter  Procedures   Comprehensive metabolic panel   CBC with Differential/Platelet   Meds ordered this encounter  Medications   azithromycin (ZITHROMAX) 250 MG tablet    Sig: Take 2 tabs today, then 1 tab daily for 4 days    Dispense:  1 each    Refill:  0   guaiFENesin-codeine 100-10 MG/5ML syrup    Sig: Take 5 mLs by mouth every 6 (six) hours as needed for cough.    Dispense:  120 mL    Refill:  0   ivabradine (CORLANOR) 5 MG TABS tablet    Sig: Take 1 tablet (5 mg total) by mouth 2 (two) times daily with a meal.    Dispense:  180 tablet    Refill:  3   losartan-hydrochlorothiazide (HYZAAR) 100-25 MG tablet    Sig: Take 1 tablet by mouth daily.    Dispense:  90 tablet    Refill:  3   metoprolol (TOPROL-XL) 200 MG 24 hr tablet    Sig: Take 1 tablet (200 mg total) by mouth daily.    Dispense:  90 tablet    Refill:  3      BP Readings from Last 3 Encounters:  09/15/21 130/87  10/26/20 (!) 140/92  04/27/20 136/90   Wt Readings from Last 3 Encounters:  09/15/21 160 lb 3.2 oz (72.7 kg)  10/26/20 174 lb 12.8 oz (79.3 kg)  04/27/20 175 lb 9.6 oz (79.7 kg)    Lab Results  Component Value Date   CHOL 202 (H)  04/27/2020   CHOL 180 02/11/2019   CHOL 173 12/13/2017   Lab Results  Component Value Date   HDL 109 04/27/2020   HDL 91.60 02/11/2019   HDL 88.70 12/13/2017   Lab Results  Component Value Date   LDLCALC 69 04/27/2020   LDLCALC 53 02/11/2019   LDLCALC 50 12/13/2017   Lab Results  Component Value Date   TRIG 165 (H) 04/27/2020   TRIG 175.0 (H) 02/11/2019   TRIG 176.0 (H) 12/13/2017   Lab Results  Component Value Date   CHOLHDL 1.9 04/27/2020   CHOLHDL 2 02/11/2019   CHOLHDL 2 12/13/2017   No results found for: LDLDIRECT Lab Results  Component Value Date   CREATININE 0.82 10/26/2020   BUN 11 10/26/2020   NA 142 10/26/2020   K 4.3 10/26/2020   CL 102 10/26/2020   CO2 27 10/26/2020    The ASCVD Risk score (Arnett DK, et al., 2019) failed  to calculate for the following reasons:   The valid HDL cholesterol range is 20 to 100 mg/dL  I reviewed the patients updated PMH, FH, and SocHx.    Patient Active Problem List   Diagnosis Date Noted   Adenomatous polyp 11/06/2017    Priority: High   Essential hypertension 02/28/2012    Priority: High   Hepatic steatosis 01/05/2021    Priority: Medium    GERD without esophagitis 06/11/2018    Priority: Medium    Inappropriate sinus tachycardia 11/06/2017    Priority: Medium    Subclinical hypothyroidism 11/06/2017    Priority: Medium    Chronic diarrhea 11/06/2017    Priority: Medium    Obesity (BMI 30-39.9) 11/06/2017    Priority: Medium    Cough variant asthma 07/06/2007    Priority: Medium    Bilateral low frequency hearing loss 11/06/2017    Priority: Low   Seasonal and perennial allergic rhinitis 07/06/2007    Priority: Low    Allergies: Codeine  Social History: Patient  reports that she has never smoked. She has never used smokeless tobacco. She reports current alcohol use. She reports that she does not use drugs.  Current Meds  Medication Sig   albuterol (PROAIR HFA) 108 (90 Base) MCG/ACT inhaler Inhale 2 puffs into the lungs 4 (four) times daily as needed.   Ascorbic Acid (VITAMIN C) 500 MG CAPS Take 1 tablet by mouth 2 (two) times daily.   azithromycin (ZITHROMAX) 250 MG tablet Take 2 tabs today, then 1 tab daily for 4 days   budesonide-formoterol (SYMBICORT) 160-4.5 MCG/ACT inhaler Inhale into the lungs.   calcium carbonate (OS-CAL) 600 MG TABS tablet Take 600 mg by mouth daily.   EPINEPHrine (EPI-PEN) 0.3 mg/0.3 mL SOAJ injection Inject into thigh for severe allergic reaction   guaiFENesin-codeine 100-10 MG/5ML syrup Take 5 mLs by mouth every 6 (six) hours as needed for cough.   Multiple Vitamin (MULTIVITAMIN) tablet Take 1 tablet by mouth daily.   omeprazole (PRILOSEC) 20 MG capsule TAKE 1 CAPSULE DAILY;      OFFICE VISIT NEEDED   [DISCONTINUED] ivabradine  (CORLANOR) 5 MG TABS tablet Take 1 tablet (5 mg total) by mouth 2 (two) times daily with a meal.   [DISCONTINUED] metoprolol (TOPROL-XL) 200 MG 24 hr tablet Take 1 tablet (200 mg total) by mouth daily.    Review of Systems: Cardiovascular: negative for chest pain, palpitations, leg swelling, orthopnea Respiratory: negative for SOB, wheezing or persistent cough Gastrointestinal: negative for abdominal pain Genitourinary: negative for dysuria or gross hematuria  Objective  Vitals: BP 130/87   Pulse 91   Temp 99.8 F (37.7 C) (Temporal)   Ht 5\' 3"  (1.6 m)   Wt 160 lb 3.2 oz (72.7 kg)   SpO2 97%   BMI 28.38 kg/m  General: no acute distress but appears tired Psych:  Alert and oriented, normal mood and affect HEENT:  Normocephalic, atraumatic, supple neck , red throat w/o exudate. Dull tms, no lad Cardiovascular:  RRR without murmur. no edema Respiratory:  Good breath sounds bilaterally, CTAB with normal respiratory effort Skin:  Warm, no rashes Neurologic:   Mental status is normal Commons side effects, risks, benefits, and alternatives for medications and treatment plan prescribed today were discussed, and the patient expressed understanding of the given instructions. Patient is instructed to call or message via MyChart if he/she has any questions or concerns regarding our treatment plan. No barriers to understanding were identified. We discussed Red Flag symptoms and signs in detail. Patient expressed understanding regarding what to do in case of urgent or emergency type symptoms.  Medication list was reconciled, printed and provided to the patient in AVS. Patient instructions and summary information was reviewed with the patient as documented in the AVS. This note was prepared with assistance of Dragon voice recognition software. Occasional wrong-word or sound-a-like substitutions may have occurred due to the inherent limitations of voice recognition software  This visit occurred during  the SARS-CoV-2 public health emergency.  Safety protocols were in place, including screening questions prior to the visit, additional usage of staff PPE, and extensive cleaning of exam room while observing appropriate contact time as indicated for disinfecting solutions.

## 2021-09-15 NOTE — Patient Instructions (Signed)
Please return in 3-4 months for your annual complete physical; please come fasting.  ? ?I will release your lab results to you on your MyChart account with further instructions. You may see the results before I do, but when I review them I will send you a message with my report or have my assistant call you if things need to be discussed. Please reply to my message with any questions. Thank you!  ? ?If you have any questions or concerns, please don't hesitate to send me a message via MyChart or call the office at [phone]. Thank you for visiting with [place] today! It's our pleasure caring for you.  ?

## 2021-09-16 LAB — CBC WITH DIFFERENTIAL/PLATELET
Basophils Absolute: 0 10*3/uL (ref 0.0–0.1)
Basophils Relative: 0.7 % (ref 0.0–3.0)
Eosinophils Absolute: 0 10*3/uL (ref 0.0–0.7)
Eosinophils Relative: 0.4 % (ref 0.0–5.0)
HCT: 35 % — ABNORMAL LOW (ref 36.0–46.0)
Hemoglobin: 12 g/dL (ref 12.0–15.0)
Lymphocytes Relative: 6 % — ABNORMAL LOW (ref 12.0–46.0)
Lymphs Abs: 0.4 10*3/uL — ABNORMAL LOW (ref 0.7–4.0)
MCHC: 34.3 g/dL (ref 30.0–36.0)
MCV: 104.1 fl — ABNORMAL HIGH (ref 78.0–100.0)
Monocytes Absolute: 0.6 10*3/uL (ref 0.1–1.0)
Monocytes Relative: 10.3 % (ref 3.0–12.0)
Neutro Abs: 5.1 10*3/uL (ref 1.4–7.7)
Neutrophils Relative %: 82.6 % — ABNORMAL HIGH (ref 43.0–77.0)
Platelets: 171 10*3/uL (ref 150.0–400.0)
RBC: 3.37 Mil/uL — ABNORMAL LOW (ref 3.87–5.11)
RDW: 13.2 % (ref 11.5–15.5)
WBC: 6.2 10*3/uL (ref 4.0–10.5)

## 2021-09-16 LAB — COMPREHENSIVE METABOLIC PANEL
ALT: 49 U/L — ABNORMAL HIGH (ref 0–35)
AST: 80 U/L — ABNORMAL HIGH (ref 0–37)
Albumin: 4.7 g/dL (ref 3.5–5.2)
Alkaline Phosphatase: 148 U/L — ABNORMAL HIGH (ref 39–117)
BUN: 9 mg/dL (ref 6–23)
CO2: 28 mEq/L (ref 19–32)
Calcium: 10 mg/dL (ref 8.4–10.5)
Chloride: 91 mEq/L — ABNORMAL LOW (ref 96–112)
Creatinine, Ser: 0.96 mg/dL (ref 0.40–1.20)
GFR: 68.22 mL/min (ref >60.00)
Glucose, Bld: 94 mg/dL (ref 70–99)
Potassium: 3.9 mEq/L (ref 3.5–5.1)
Sodium: 128 mEq/L — ABNORMAL LOW (ref 135–145)
Total Bilirubin: 0.6 mg/dL (ref 0.2–1.2)
Total Protein: 7.6 g/dL (ref 6.0–8.3)

## 2021-09-17 ENCOUNTER — Other Ambulatory Visit: Payer: Self-pay

## 2021-09-17 DIAGNOSIS — I10 Essential (primary) hypertension: Secondary | ICD-10-CM

## 2021-09-17 MED ORDER — LOSARTAN POTASSIUM 100 MG PO TABS: 100.0000 mg | ORAL_TABLET | Freq: Every day | ORAL | 3 refills | Status: DC

## 2021-09-21 ENCOUNTER — Encounter: Payer: Self-pay | Admitting: Family Medicine

## 2021-09-22 ENCOUNTER — Other Ambulatory Visit: Payer: Self-pay

## 2021-09-22 DIAGNOSIS — K760 Fatty (change of) liver, not elsewhere classified: Secondary | ICD-10-CM

## 2021-09-22 NOTE — Telephone Encounter (Signed)
I spoke with the pt to give lab result message from Dr. Jonni Sanger. I explained that the Hyzaar was the Losartan-HCTZ combination. She voiced understanding. Labs have been placed, and she will call back to schedule appointment.  ?

## 2021-10-08 ENCOUNTER — Encounter: Payer: Self-pay | Admitting: Family Medicine

## 2021-10-18 ENCOUNTER — Other Ambulatory Visit: Payer: Self-pay

## 2021-10-18 ENCOUNTER — Encounter: Payer: Self-pay | Admitting: Family Medicine

## 2021-10-18 DIAGNOSIS — I10 Essential (primary) hypertension: Secondary | ICD-10-CM

## 2021-10-18 MED ORDER — LOSARTAN POTASSIUM 100 MG PO TABS: 100.0000 mg | ORAL_TABLET | Freq: Every day | ORAL | 3 refills | Status: DC

## 2021-10-27 ENCOUNTER — Encounter: Payer: Self-pay | Admitting: Family Medicine

## 2021-10-27 ENCOUNTER — Ambulatory Visit: Payer: No Typology Code available for payment source | Admitting: Family Medicine

## 2021-10-27 VITALS — BP 110/70 | HR 75 | Temp 98.9°F | Ht 63.0 in | Wt 161.6 lb

## 2021-10-27 DIAGNOSIS — K760 Fatty (change of) liver, not elsewhere classified: Secondary | ICD-10-CM

## 2021-10-27 DIAGNOSIS — E038 Other specified hypothyroidism: Secondary | ICD-10-CM

## 2021-10-27 DIAGNOSIS — I10 Essential (primary) hypertension: Secondary | ICD-10-CM | POA: Diagnosis not present

## 2021-10-27 DIAGNOSIS — E871 Hypo-osmolality and hyponatremia: Secondary | ICD-10-CM | POA: Diagnosis not present

## 2021-10-27 DIAGNOSIS — D7589 Other specified diseases of blood and blood-forming organs: Secondary | ICD-10-CM

## 2021-10-27 LAB — CBC WITH DIFFERENTIAL/PLATELET
Basophils Absolute: 0 10*3/uL (ref 0.0–0.1)
Basophils Relative: 0.8 % (ref 0.0–3.0)
Eosinophils Absolute: 0.2 10*3/uL (ref 0.0–0.7)
Eosinophils Relative: 2.6 % (ref 0.0–5.0)
HCT: 37 % (ref 36.0–46.0)
Hemoglobin: 12.5 g/dL (ref 12.0–15.0)
Lymphocytes Relative: 44.2 % (ref 12.0–46.0)
Lymphs Abs: 2.7 10*3/uL (ref 0.7–4.0)
MCHC: 33.8 g/dL (ref 30.0–36.0)
MCV: 106.4 fl — ABNORMAL HIGH (ref 78.0–100.0)
Monocytes Absolute: 0.5 10*3/uL (ref 0.1–1.0)
Monocytes Relative: 7.8 % (ref 3.0–12.0)
Neutro Abs: 2.7 10*3/uL (ref 1.4–7.7)
Neutrophils Relative %: 44.6 % (ref 43.0–77.0)
Platelets: 191 10*3/uL (ref 150.0–400.0)
RBC: 3.48 Mil/uL — ABNORMAL LOW (ref 3.87–5.11)
RDW: 15.2 % (ref 11.5–15.5)
WBC: 6.1 10*3/uL (ref 4.0–10.5)

## 2021-10-27 LAB — COMPREHENSIVE METABOLIC PANEL WITH GFR
ALT: 23 U/L (ref 0–35)
AST: 24 U/L (ref 0–37)
Albumin: 4.6 g/dL (ref 3.5–5.2)
Alkaline Phosphatase: 131 U/L — ABNORMAL HIGH (ref 39–117)
BUN: 12 mg/dL (ref 6–23)
CO2: 27 meq/L (ref 19–32)
Calcium: 10.1 mg/dL (ref 8.4–10.5)
Chloride: 103 meq/L (ref 96–112)
Creatinine, Ser: 0.89 mg/dL (ref 0.40–1.20)
GFR: 74.64 mL/min
Glucose, Bld: 74 mg/dL (ref 70–99)
Potassium: 4.3 meq/L (ref 3.5–5.1)
Sodium: 139 meq/L (ref 135–145)
Total Bilirubin: 0.5 mg/dL (ref 0.2–1.2)
Total Protein: 7.5 g/dL (ref 6.0–8.3)

## 2021-10-27 LAB — TSH: TSH: 3.67 u[IU]/mL (ref 0.35–5.50)

## 2021-10-27 LAB — B12 AND FOLATE PANEL
Folate: 6.6 ng/mL (ref >5.9)
Vitamin B-12: 183 pg/mL — ABNORMAL LOW (ref 211–911)

## 2021-10-27 LAB — T4, FREE: Free T4: 0.67 ng/dL (ref 0.60–1.60)

## 2021-10-27 NOTE — Patient Instructions (Addendum)
Please follow up as scheduled for your next visit with me: 01/03/2022  ? ?I will release your lab results to you on your MyChart account with further instructions. You may see the results before I do, but when I review them I will send you a message with my report or have my assistant call you if things need to be discussed. Please reply to my message with any questions. Thank you!  ? ?If you have any questions or concerns, please don't hesitate to send me a message via MyChart or call the office at [phone]. Thank you for visiting with [place] today! It's our pleasure caring for you.  ?

## 2021-10-27 NOTE — Progress Notes (Signed)
? ?Subjective  ?CC:  ?Chief Complaint  ?Patient presents with  ? Hypertension  ?  Pt here for Bp check and some labs  ? ? ?HPI: Evelyn Chase is a 51 y.o. female who presents to the office today to address the problems listed above in the chief complaint. ?Hypertension f/u: Due for recheck.  Stopped HCTZ due to hyponatremia.  Increased dose of losartan.  Remains on losartan, ivabradine, and metoprolol 200 mg daily.  Feeling well.  Blood pressure at home has been running very normal.  Less nocturia since being off the diuretic.  Eating and drinking well.  No chest pain ?History of subclinical hypothyroidism with last labs being greater than a year ago.  Energy level is good.  Due for recheck ?Hepatic steatosis with elevated liver function test.  Monitoring.  Recommended low-fat diet ?Macrocytosis without anemia on last labs.  She does not take over-the-counter supplements. ? ?Assessment  ?1. Essential hypertension   ?2. Hyponatremia   ?3. Hepatic steatosis   ?4. Subclinical hypothyroidism   ?5. Macrocytosis   ? ?  ?Plan  ? ?Hypertension f/u: BP control is well controlled.  Continue current medications.  Recheck sodium and potassium levels ?Fatty liver f/u: Recommend low-fat diet.  Weight loss.  Monitoring LFTs.  Has history of positive ANA with low antibody titer.  No other abnormal labs and work-up for elevated LFTs identified. ?Recheck thyroid function tests ?Rule out B12 with folate deficiency ?Education regarding management of these chronic disease states was given. Management strategies discussed on successive visits include dietary and exercise recommendations, goals of achieving and maintaining IBW, and lifestyle modifications aiming for adequate sleep and minimizing stressors.  ? ?Follow up: As scheduled for complete physical in July ? ?Orders Placed This Encounter  ?Procedures  ? Comprehensive metabolic panel  ? TSH  ? T4, free  ? T3  ? CBC with Differential/Platelet  ? B12 and Folate Panel  ? ?No  orders of the defined types were placed in this encounter. ? ?  ? ?BP Readings from Last 3 Encounters:  ?10/27/21 110/70  ?09/15/21 130/87  ?10/26/20 (!) 140/92  ? ?Wt Readings from Last 3 Encounters:  ?10/27/21 161 lb 9.6 oz (73.3 kg)  ?09/15/21 160 lb 3.2 oz (72.7 kg)  ?10/26/20 174 lb 12.8 oz (79.3 kg)  ? ? ?Lab Results  ?Component Value Date  ? CHOL 202 (H) 04/27/2020  ? CHOL 180 02/11/2019  ? CHOL 173 12/13/2017  ? ?Lab Results  ?Component Value Date  ? HDL 109 04/27/2020  ? HDL 91.60 02/11/2019  ? HDL 88.70 12/13/2017  ? ?Lab Results  ?Component Value Date  ? [place] 69 04/27/2020  ? [place] 53 02/11/2019  ? [place] 50 12/13/2017  ? ?Lab Results  ?Component Value Date  ? TRIG 165 (H) 04/27/2020  ? TRIG 175.0 (H) 02/11/2019  ? TRIG 176.0 (H) 12/13/2017  ? ?Lab Results  ?Component Value Date  ? CHOLHDL 1.9 04/27/2020  ? CHOLHDL 2 02/11/2019  ? CHOLHDL 2 12/13/2017  ? ?No results found for: LDLDIRECT ?Lab Results  ?Component Value Date  ? CREATININE 0.96 09/15/2021  ? BUN 9 09/15/2021  ? NA 128 (L) 09/15/2021  ? K 3.9 09/15/2021  ? CL 91 (L) 09/15/2021  ? CO2 28 09/15/2021  ? ? ?The ASCVD Risk score (Arnett DK, et al., 2019) failed to calculate for the following reasons: ?  The valid HDL cholesterol range is 20 to 100 mg/dL ? ?I reviewed the patients updated PMH, FH,  and SocHx.  ?  ?Patient Active Problem List  ? Diagnosis Date Noted  ? Adenomatous polyp 11/06/2017  ?  Priority: High  ? Essential hypertension 02/28/2012  ?  Priority: High  ? Hepatic steatosis 01/05/2021  ?  Priority: Medium   ? GERD without esophagitis 06/11/2018  ?  Priority: Medium   ? Inappropriate sinus tachycardia 11/06/2017  ?  Priority: Medium   ? Subclinical hypothyroidism 11/06/2017  ?  Priority: Medium   ? Chronic diarrhea 11/06/2017  ?  Priority: Medium   ? Obesity (BMI 30-39.9) 11/06/2017  ?  Priority: Medium   ? Cough variant asthma 07/06/2007  ?  Priority: Medium   ? Bilateral low frequency hearing loss 11/06/2017  ?  Priority: Low   ? Seasonal and perennial allergic rhinitis 07/06/2007  ?  Priority: Low  ? ? ?Allergies: Codeine ? ?Social History: ?Patient  reports that she has never smoked. She has never used smokeless tobacco. She reports current alcohol use. She reports that she does not use drugs. ? ?Current Meds  ?Medication Sig  ? albuterol (PROAIR HFA) 108 (90 Base) MCG/ACT inhaler Inhale 2 puffs into the lungs 4 (four) times daily as needed.  ? Ascorbic Acid (VITAMIN C) 500 MG CAPS Take 1 tablet by mouth 2 (two) times daily.  ? budesonide-formoterol (SYMBICORT) 160-4.5 MCG/ACT inhaler Inhale into the lungs.  ? calcium carbonate (OS-CAL) 600 MG TABS tablet Take 600 mg by mouth daily.  ? EPINEPHrine (EPI-PEN) 0.3 mg/0.3 mL SOAJ injection Inject into thigh for severe allergic reaction  ? ivabradine (CORLANOR) 5 MG TABS tablet Take 1 tablet (5 mg total) by mouth 2 (two) times daily with a meal.  ? losartan (COZAAR) 100 MG tablet Take 1 tablet (100 mg total) by mouth daily.  ? metoprolol (TOPROL-XL) 200 MG 24 hr tablet Take 1 tablet (200 mg total) by mouth daily.  ? Multiple Vitamin (MULTIVITAMIN) tablet Take 1 tablet by mouth daily.  ? omeprazole (PRILOSEC) 20 MG capsule TAKE 1 CAPSULE DAILY;      OFFICE VISIT NEEDED  ? [DISCONTINUED] guaiFENesin-codeine 100-10 MG/5ML syrup Take 5 mLs by mouth every 6 (six) hours as needed for cough.  ? ? ?Review of Systems: ?Cardiovascular: negative for chest pain, palpitations, leg swelling, orthopnea ?Respiratory: negative for SOB, wheezing or persistent cough ?Gastrointestinal: negative for abdominal pain ?Genitourinary: negative for dysuria or gross hematuria ? ?Objective  ?Vitals: BP 110/70   Pulse 75   Temp 98.9 ?F (37.2 ?C)   Ht '5\' 3"'$  (1.6 m)   Wt 161 lb 9.6 oz (73.3 kg)   SpO2 92%   BMI 28.63 kg/m?  ?General: no acute distress  ?Psych:  Alert and oriented, normal mood and affect ?Neurologic:   Mental status is normal ?Commons side effects, risks, benefits, and alternatives for medications and  treatment plan prescribed today were discussed, and the patient expressed understanding of the given instructions. Patient is instructed to call or message via MyChart if he/she has any questions or concerns regarding our treatment plan. No barriers to understanding were identified. We discussed Red Flag symptoms and signs in detail. Patient expressed understanding regarding what to do in case of urgent or emergency type symptoms.  ?Medication list was reconciled, printed and provided to the patient in AVS. Patient instructions and summary information was reviewed with the patient as documented in the AVS. ?This note was prepared with assistance of Systems analyst. Occasional wrong-word or sound-a-like substitutions may have occurred due to the inherent limitations  of voice recognition software ? ?This visit occurred during the SARS-CoV-2 public health emergency.  Safety protocols were in place, including screening questions prior to the visit, additional usage of staff PPE, and extensive cleaning of exam room while observing appropriate contact time as indicated for disinfecting solutions.  ?

## 2021-10-28 LAB — T3: T3, Total: 104 ng/dL (ref 76–181)

## 2021-10-29 ENCOUNTER — Encounter: Payer: Self-pay | Admitting: Family Medicine

## 2021-10-29 DIAGNOSIS — E538 Deficiency of other specified B group vitamins: Secondary | ICD-10-CM | POA: Insufficient documentation

## 2021-10-31 ENCOUNTER — Encounter: Payer: Self-pay | Admitting: Family Medicine

## 2021-11-01 ENCOUNTER — Other Ambulatory Visit: Payer: Self-pay | Admitting: *Deleted

## 2021-11-01 MED ORDER — METOPROLOL SUCCINATE ER 200 MG PO TB24: 200.0000 mg | ORAL_TABLET | Freq: Every day | ORAL | 3 refills | Status: DC

## 2021-11-19 ENCOUNTER — Other Ambulatory Visit: Payer: Self-pay | Admitting: Family Medicine

## 2021-12-08 ENCOUNTER — Encounter: Payer: Self-pay | Admitting: Family Medicine

## 2021-12-08 ENCOUNTER — Other Ambulatory Visit: Payer: Self-pay

## 2021-12-08 MED ORDER — METOPROLOL SUCCINATE ER 200 MG PO TB24: 200.0000 mg | ORAL_TABLET | Freq: Every day | ORAL | 3 refills | Status: DC

## 2022-01-03 ENCOUNTER — Ambulatory Visit (INDEPENDENT_AMBULATORY_CARE_PROVIDER_SITE_OTHER): Payer: No Typology Code available for payment source | Admitting: Family Medicine

## 2022-01-03 ENCOUNTER — Encounter: Payer: Self-pay | Admitting: Family Medicine

## 2022-01-03 VITALS — BP 138/80 | HR 53 | Temp 98.2°F | Ht 63.0 in | Wt 163.0 lb

## 2022-01-03 DIAGNOSIS — Z23 Encounter for immunization: Secondary | ICD-10-CM

## 2022-01-03 DIAGNOSIS — K529 Noninfective gastroenteritis and colitis, unspecified: Secondary | ICD-10-CM

## 2022-01-03 DIAGNOSIS — J45991 Cough variant asthma: Secondary | ICD-10-CM | POA: Diagnosis not present

## 2022-01-03 DIAGNOSIS — I10 Essential (primary) hypertension: Secondary | ICD-10-CM

## 2022-01-03 DIAGNOSIS — E538 Deficiency of other specified B group vitamins: Secondary | ICD-10-CM | POA: Diagnosis not present

## 2022-01-03 DIAGNOSIS — K760 Fatty (change of) liver, not elsewhere classified: Secondary | ICD-10-CM | POA: Diagnosis not present

## 2022-01-03 DIAGNOSIS — Z0000 Encounter for general adult medical examination without abnormal findings: Secondary | ICD-10-CM

## 2022-01-03 LAB — VITAMIN B12: Vitamin B-12: 1063 pg/mL — ABNORMAL HIGH (ref 211–911)

## 2022-01-03 NOTE — Progress Notes (Signed)
Subjective  Chief Complaint  Patient presents with   Annual Exam    Pt here for annual exam and is currently fasting    HPI: Evelyn Chase is a 51 y.o. female who presents to [facility] today for a Female Wellness Visit. She also has the concerns and/or needs as listed above in the chief complaint. These will be addressed in addition to the Health Maintenance Visit.   Wellness Visit: annual visit with health maintenance review and exam without Pap  Health maintenance is current.  Eligible for Shingrix.  Had recent colonoscopy.  Sees Dr. Watt Climes. Chronic disease f/u and/or acute problem visit: (deemed necessary to be done in addition to the wellness visit): Hypertension is well controlled.  No chest pain or shortness of breath.  Compliant with medications.  Recent renal function and cholesterol panel are stable History of hepatic steatosis with normal recent LFTs.  Eating low-fat.  Working on weight loss. B12 deficiency now on oral supplements for 3 months.  Ready for recheck.  has macrocytosis.  She is not anemic Cough variant asthma: Does a little worse in the humidity but has not had any recent exacerbations.  Rare rescue inhaler use Chronic diarrhea ongoing.  Seeing GI.  Recent colonoscopy noncontributory.  Assessment  1. Annual physical exam   2. Essential hypertension   3. Hepatic steatosis   4. Vitamin B 12 deficiency   5. Cough variant asthma   6. Chronic diarrhea   7. Need for shingles vaccine      Plan  Female Wellness Visit: Age appropriate Health Maintenance and Prevention measures were discussed with patient. Included topics are cancer screening recommendations, ways to keep healthy (see AVS) including dietary and exercise recommendations, regular eye and dental care, use of seat belts, and avoidance of moderate alcohol use and tobacco use.  BMI: discussed patient's BMI and encouraged positive lifestyle modifications to help get to or  maintain a target BMI. HM needs and immunizations were addressed and ordered. See below for orders. See HM and immunization section for updates. Routine labs and screening tests ordered including cmp, cbc and lipids where appropriate. Discussed recommendations regarding Vit D and calcium supplementation (see AVS)  Chronic disease management visit and/or acute problem visit: Hypertension is well controlled.Continue metoprolol, ivabradine and losartan.  Renal function stable. Cough.  Asthma: Continue Symbicort.  Symptomatically well controlled Hepatic steatosis: Continue low-fat diet.  Normal LFTs recently Recheck B12 levels on oral supplements. Chronic diarrhea: Question absorption.  GI following Shingles vaccine updated today.  Follow up: 12 months for complete physical and recheck  Orders Placed This Encounter  Procedures   Varicella-zoster vaccine IM (Shingrix)   Vitamin B12   No orders of the defined types were placed in this encounter.     Body mass index is 28.87 kg/m. Wt Readings from Last 3 Encounters:  01/03/22 163 lb (73.9 kg)  10/27/21 161 lb 9.6 oz (73.3 kg)  09/15/21 160 lb 3.2 oz (72.7 kg)     Patient Active Problem List   Diagnosis Date Noted   Adenomatous polyp 11/06/2017    Priority: High    Dr. Watt Climes, q 3 year colonoscopy surveillance    Essential hypertension 02/28/2012    Priority: High   Hepatic steatosis 01/05/2021    Priority: Medium     Elevated LFTs; RUQ ultrasound 12/2020    GERD without esophagitis 06/11/2018    Priority: Medium    Inappropriate sinus tachycardia 11/06/2017    Priority:  Medium     Evaluated January 2019 by cards, Dr. Carlis Abbott, [facility]; nl echocardiogam. Started ivabradine to slow heart rate in addition to BB.    Subclinical hypothyroidism 11/06/2017    Priority: Medium    Chronic diarrhea 11/06/2017    Priority: Medium     ? Malabsorption. Requesting records from GI.    Obesity (BMI 30-39.9) 11/06/2017     Priority: Medium     Seeing a nutritionist 2019    Cough variant asthma 07/06/2007    Priority: Medium     Allergies are trigger; reports tolerated high dose beta-blocker    Bilateral low frequency hearing loss 11/06/2017    Priority: Low    Hereditary; wears hearing aides; [facility] manages    Seasonal and perennial allergic rhinitis 07/06/2007    Priority: Low    Allergy vaccine 1:50,000 07/25/2007, 1:10 06/16/2008, [place]     Vitamin B 12 deficiency 10/29/2021   Health Maintenance  Topic Date Due   COVID-19 Vaccine (3 - Pfizer series) 01/19/2022 (Originally 12/17/2019)   INFLUENZA VACCINE  01/25/2022   Zoster Vaccines- Shingrix (2 of 2) 02/28/2022   MAMMOGRAM  04/13/2022   TETANUS/TDAP  11/09/2022   PAP SMEAR-Modifier  04/27/2025   COLONOSCOPY (Pts 45-19yr Insurance coverage will need to be confirmed)  08/05/2031   Hepatitis C Screening  Completed   HIV Screening  Completed   HPV VACCINES  Aged Out   Immunization History  Administered Date(s) Administered   Influenza Split 03/27/2012, 03/27/2013, 04/06/2014, 03/28/2015   Influenza, Seasonal, Injecte, Preservative Fre 03/28/2015, 03/31/2016   Influenza,inj,Quad PF,6+ Mos 03/08/2018, 04/27/2020   PFIZER(Purple Top)SARS-COV-2 Vaccination 09/28/2019, 10/22/2019   Pneumococcal Polysaccharide-23 02/24/2015, 04/28/2015   Tdap 11/08/2012   Zoster Recombinat (Shingrix) 01/03/2022   We updated and reviewed the patient's past history in detail and it is documented below. Allergies: Patient is allergic to codeine. Past Medical History Patient  has a past medical history of Adenomatous polyp (11/06/2017), Allergic rhinitis, Asthma, Chronic diarrhea (11/06/2017), Chronic sinusitis with recurrent bronchitis, Hepatic steatosis (01/05/2021), Hypertension, Obesity (BMI 30-39.9) (11/06/2017), Pneumonia, and Subclinical hypothyroidism (11/06/2017). Past Surgical History Patient  has a past surgical history that includes Breast enhancement  surgery; Knee surgery; Tendon release; Shoulder surgery; Cholecystectomy (N/A, 07/22/2016); Finger surgery; and bilateral tempromandibular joint arthroplasty. Family History: Patient family history includes Arthritis in her father; Asthma in her father; COPD in her father; Dementia in her mother; Diabetes in her father; Emphysema in her father; Heart attack in her father; Heart disease in her father; Hyperlipidemia in her father; Hypertension in her father; Lung cancer (age of onset: 344 in her mother; Tuberculosis in her father. Social History:  Patient  reports that she has never smoked. She has never used smokeless tobacco. She reports current alcohol use. She reports that she does not use drugs.  Review of Systems: Constitutional: negative for fever or malaise Ophthalmic: negative for photophobia, double vision or loss of vision Cardiovascular: negative for chest pain, dyspnea on exertion, or new LE swelling Respiratory: negative for SOB or persistent cough Gastrointestinal: negative for abdominal pain, change in bowel habits or melena Genitourinary: negative for dysuria or gross hematuria, no abnormal uterine bleeding or disharge Musculoskeletal: negative for new gait disturbance or muscular weakness Integumentary: negative for new or persistent rashes, no breast lumps Neurological: negative for TIA or stroke symptoms Psychiatric: negative for SI or delusions Allergic/Immunologic: negative for hives  Patient Care Team    Relationship Specialty Notifications Start End  ALeamon Arnt MD PCP -  General Family Medicine  11/06/17   Clarene Essex, MD Consulting Physician Gastroenterology  11/06/17   Lorenda Cahill, DMD  Dentistry  11/06/17   Rhea Belton, MD Consulting Physician Cardiology  02/18/19     Objective  Vitals: BP 138/80   Pulse (!) 53   Temp 98.2 F (36.8 C)   Ht '5\' 3"'$  (1.6 m)   Wt 163 lb (73.9 kg)   SpO2 99%   BMI 28.87 kg/m  General:  Well developed, well  nourished, no acute distress  Psych:  Alert and orientedx3,normal mood and affect HEENT:  Normocephalic, atraumatic, non-icteric sclera,  supple neck without adenopathy, mass or thyromegaly Cardiovascular:  Normal S1, S2, RRR without gallop, rub or murmur Respiratory:  Good breath sounds bilaterally, CTAB with normal respiratory effort Gastrointestinal: normal bowel sounds, soft, non-tender, no noted masses. No HSM MSK: no deformities, contusions. Joints are without erythema or swelling.  Skin:  Warm, no rashes or suspicious lesions noted Neurologic:    Mental status is normal. CN 2-11 are normal. Gross motor and sensory exams are normal. Normal gait. No tremor   Commons side effects, risks, benefits, and alternatives for medications and treatment plan prescribed today were discussed, and the patient expressed understanding of the given instructions. Patient is instructed to call or message via MyChart if he/she has any questions or concerns regarding our treatment plan. No barriers to understanding were identified. We discussed Red Flag symptoms and signs in detail. Patient expressed understanding regarding what to do in case of urgent or emergency type symptoms.  Medication list was reconciled, printed and provided to the patient in AVS. Patient instructions and summary information was reviewed with the patient as documented in the AVS. This note was prepared with assistance of Dragon voice recognition software. Occasional wrong-word or sound-a-like substitutions may have occurred due to the inherent limitations of voice recognition software  This visit occurred during the SARS-CoV-2 public health emergency.  Safety protocols were in place, including screening questions prior to the visit, additional usage of staff PPE, and extensive cleaning of exam room while observing appropriate contact time as indicated for disinfecting solutions.

## 2022-01-03 NOTE — Patient Instructions (Signed)
Please return in 12 months for your annual complete physical; please come fasting.   I will send your b12 level to you.   If you have any questions or concerns, please don't hesitate to send me a message via MyChart or call the office at [phone]. Thank you for visiting with [place] today! It's our pleasure caring for you.

## 2022-03-07 DIAGNOSIS — D225 Melanocytic nevi of trunk: Secondary | ICD-10-CM | POA: Diagnosis not present

## 2022-03-07 DIAGNOSIS — K130 Diseases of lips: Secondary | ICD-10-CM | POA: Diagnosis not present

## 2022-03-07 DIAGNOSIS — D485 Neoplasm of uncertain behavior of skin: Secondary | ICD-10-CM | POA: Diagnosis not present

## 2022-03-07 DIAGNOSIS — B078 Other viral warts: Secondary | ICD-10-CM | POA: Diagnosis not present

## 2022-03-07 DIAGNOSIS — L218 Other seborrheic dermatitis: Secondary | ICD-10-CM | POA: Diagnosis not present

## 2022-03-21 ENCOUNTER — Encounter: Payer: Self-pay | Admitting: *Deleted

## 2022-03-24 ENCOUNTER — Encounter: Payer: Self-pay | Admitting: Family Medicine

## 2022-03-24 ENCOUNTER — Ambulatory Visit: Payer: No Typology Code available for payment source | Admitting: Family Medicine

## 2022-03-24 VITALS — BP 122/74 | HR 63 | Temp 98.8°F | Ht 63.0 in | Wt 162.8 lb

## 2022-03-24 DIAGNOSIS — J029 Acute pharyngitis, unspecified: Secondary | ICD-10-CM | POA: Diagnosis not present

## 2022-03-24 DIAGNOSIS — J0100 Acute maxillary sinusitis, unspecified: Secondary | ICD-10-CM

## 2022-03-24 DIAGNOSIS — R051 Acute cough: Secondary | ICD-10-CM

## 2022-03-24 LAB — POCT RAPID STREP A (OFFICE): Rapid Strep A Screen: NEGATIVE

## 2022-03-24 LAB — POC COVID19 BINAXNOW: SARS Coronavirus 2 Ag: NEGATIVE

## 2022-03-24 MED ORDER — AMOXICILLIN-POT CLAVULANATE 875-125 MG PO TABS: 1.0000 | ORAL_TABLET | Freq: Two times a day (BID) | ORAL | 0 refills | Status: AC

## 2022-03-24 NOTE — Progress Notes (Signed)
Subjective   CC:  Chief Complaint  Patient presents with   Sore Throat    Pt has had a sore throat and cough since 03/20/2022 with no changes of getting worse. Taking mucinex DM to help   Cough    HPI: Evelyn Chase is a 51 y.o. female who presents to the office today to address the problems listed above in the chief complaint. Patient reports sinus congestion and pressure with thick drainage, mild nonproductive cough, ear pressure without pain, and mild malaise.  Symptoms have been present for several days.Shedenies high fevers but has had low grade to 100.2, no  GI symptoms, shortness of breath. Shehas had sinus infections in the past and this feels similar.  Patient is a non-smoker.  No history of asthma or COPD.  I reviewed the patients updated PMH, FH, and SocHx.    Patient Active Problem List   Diagnosis Date Noted   Adenomatous polyp 11/06/2017    Priority: High   Essential hypertension 02/28/2012    Priority: High   Hepatic steatosis 01/05/2021    Priority: Medium    GERD without esophagitis 06/11/2018    Priority: Medium    Inappropriate sinus tachycardia 11/06/2017    Priority: Medium    Subclinical hypothyroidism 11/06/2017    Priority: Medium    Chronic diarrhea 11/06/2017    Priority: Medium    Obesity (BMI 30-39.9) 11/06/2017    Priority: Medium    Cough variant asthma 07/06/2007    Priority: Medium    Bilateral low frequency hearing loss 11/06/2017    Priority: Low   Seasonal and perennial allergic rhinitis 07/06/2007    Priority: Low   Vitamin B 12 deficiency 10/29/2021   Current Meds  Medication Sig   albuterol (PROAIR HFA) 108 (90 Base) MCG/ACT inhaler Inhale 2 puffs into the lungs 4 (four) times daily as needed.   amoxicillin-clavulanate (AUGMENTIN) 875-125 MG tablet Take 1 tablet by mouth 2 (two) times daily for 10 days.   Ascorbic Acid (VITAMIN C) 500 MG CAPS Take 1 tablet by mouth 2 (two) times daily.   budesonide-formoterol (SYMBICORT)  160-4.5 MCG/ACT inhaler Inhale into the lungs.   calcium carbonate (OS-CAL) 600 MG TABS tablet Take 600 mg by mouth daily.   EPINEPHrine (EPI-PEN) 0.3 mg/0.3 mL SOAJ injection Inject into thigh for severe allergic reaction   ivabradine (CORLANOR) 5 MG TABS tablet Take 1 tablet (5 mg total) by mouth 2 (two) times daily with a meal.   losartan (COZAAR) 100 MG tablet Take 1 tablet (100 mg total) by mouth daily.   metoprolol (TOPROL-XL) 200 MG 24 hr tablet Take 1 tablet (200 mg total) by mouth daily.   Multiple Vitamin (MULTIVITAMIN) tablet Take 1 tablet by mouth daily.   omeprazole (PRILOSEC) 20 MG capsule TAKE 1 CAPSULE DAILY OFFICE VISIT NEEDED   vitamin B-12 (CYANOCOBALAMIN) 1000 MCG tablet Take 1,000 mcg by mouth daily.    Review of Systems: Cardiovascular: negative for chest pain Respiratory: negative for SOB or persistent cough Gastrointestinal: negative for abdominal pain Genitourinary: negative for dysuria or gross hematuria  Objective  Vitals: BP 122/74   Pulse 63   Temp 98.8 F (37.1 C)   Ht '5\' 3"'$  (1.6 m)   Wt 162 lb 12.8 oz (73.8 kg)   SpO2 96%   BMI 28.84 kg/m  General: no acute distress  Psych:  Alert and oriented, normal mood and affect HEENT:  Normocephalic, atraumatic, TMs with serous effusions or retraction w/o erythema, nasal mucosa  is red with purulent drainage, tender maxillary sinus present, OP mild erythematous w/o eudate, supple neck without LAD Cardiovascular:  RRR without murmur or gallop. no peripheral edema Respiratory:  Good breath sounds bilaterally, CTAB with normal respiratory effort Skin:  Warm, no rashes Neurologic:   Mental status is normal. normal gait  Neg rapid strep and covid testing today  Assessment  1. Acute non-recurrent maxillary sinusitis   2. Sore throat   3. Acute cough      Plan   Sinusitis: History and exam is most consistent with bacterial sinus infection.  Etiology and prognosis discussed with patient.  Recommend antibiotics  as ordered below.  Patient to complete course of antibiotics, use supportive medications like mucolytics and decongestants as needed.  May use Tylenol or Advil if needed.  Symptoms should improve over the next 2 weeks.  Patient will return or call if symptoms persist or worsen.  Follow up: as needed, due flu and 2nd shingrix vaccine once she is improved.   Commons side effects, risks, benefits, and alternatives for medications and treatment plan prescribed today were discussed, and the patient expressed understanding of the given instructions. Patient is instructed to call or message via MyChart if he/she has any questions or concerns regarding our treatment plan. No barriers to understanding were identified. We discussed Red Flag symptoms and signs in detail. Patient expressed understanding regarding what to do in case of urgent or emergency type symptoms.  Medication list was reconciled, printed and provided to the patient in AVS. Patient instructions and summary information was reviewed with the patient as documented in the AVS. This note was prepared with assistance of Dragon voice recognition software. Occasional wrong-word or sound-a-like substitutions may have occurred due to the inherent limitations of voice recognition software  Orders Placed This Encounter  Procedures   POC COVID-19   POCT rapid strep A   Meds ordered this encounter  Medications   amoxicillin-clavulanate (AUGMENTIN) 875-125 MG tablet    Sig: Take 1 tablet by mouth 2 (two) times daily for 10 days.    Dispense:  20 tablet    Refill:  0

## 2022-03-24 NOTE — Patient Instructions (Signed)
Can return for flu vaccine and shingrix vaccination once you are over this illness.

## 2022-03-25 ENCOUNTER — Encounter: Payer: Self-pay | Admitting: Family Medicine

## 2022-03-25 MED ORDER — GUAIFENESIN-CODEINE 100-10 MG/5ML PO SOLN: 5.0000 mL | Freq: Four times a day (QID) | ORAL | 0 refills | Status: DC | PRN

## 2022-03-25 MED ORDER — IVABRADINE HCL 5 MG PO TABS: 5.0000 mg | ORAL_TABLET | Freq: Two times a day (BID) | ORAL | 3 refills | Status: AC

## 2022-04-25 ENCOUNTER — Encounter: Payer: Self-pay | Admitting: Family Medicine

## 2022-04-26 ENCOUNTER — Ambulatory Visit: Payer: No Typology Code available for payment source | Admitting: Family Medicine

## 2022-04-26 ENCOUNTER — Ambulatory Visit: Payer: No Typology Code available for payment source

## 2022-04-26 ENCOUNTER — Encounter: Payer: Self-pay | Admitting: Family Medicine

## 2022-04-26 VITALS — BP 156/90 | HR 79 | Temp 98.2°F | Ht 63.0 in | Wt 165.4 lb

## 2022-04-26 DIAGNOSIS — G5603 Carpal tunnel syndrome, bilateral upper limbs: Secondary | ICD-10-CM | POA: Diagnosis not present

## 2022-04-26 DIAGNOSIS — Z23 Encounter for immunization: Secondary | ICD-10-CM | POA: Diagnosis not present

## 2022-04-26 MED ORDER — TRIAMCINOLONE ACETONIDE 40 MG/ML IJ SUSP
40.0000 mg | Freq: Once | INTRAMUSCULAR | Status: AC
  Administered 2022-04-26: 40 mg via INTRA_ARTICULAR

## 2022-04-26 NOTE — Patient Instructions (Signed)
Please follow up if symptoms do not improve or as needed.    You had a steroid injection today.   Things to be aware of after this injection are listed below: You may experience no significant improvement or even a slight worsening in your symptoms during the first 24 to 48 hours.  After that we expect your symptoms to improve gradually over the next 2 weeks for the medicine to have its maximal effect.  You should continue to have improvement out to 6 weeks after your injection. I recommend icing the site of the injection for 20 minutes  1-2 times the day of your injection You may shower but no swimming, tub bath or Jacuzzi for 24 hours. If your bandage falls off this does not need to be replaced.  It is appropriate to remove the bandage after 4 hours. You may resume light activities as tolerated.     POSSIBLE PROCEDURE SIDE EFFECTS: The side effects of the injection are usually fairly minimal however if you may experience some of the following side effects that are usually self-limited and will is off on their own.  If you are concerned please feel free to call the office with questions:             Increased numbness or tingling             Nausea or vomiting             Swelling or bruising at the injection site    Please call our office if if you experience any of the following symptoms over the next week as these can be signs of infection:              Fever greater than 100.43F             Significant swelling at the injection site             Significant redness or drainage from the injection site    Carpal Tunnel Syndrome  Carpal tunnel syndrome is a condition that causes pain, numbness, and weakness in your hand and fingers. The carpal tunnel is a narrow area located on the palm side of your wrist. Repeated wrist motion or certain diseases may cause swelling within the tunnel. This swelling pinches the main nerve in the wrist. The main nerve in the wrist is called the median  nerve. What are the causes? This condition may be caused by: Repeated and forceful wrist and hand motions. Wrist injuries. Arthritis. A cyst or tumor in the carpal tunnel. Fluid buildup during pregnancy. Use of tools that vibrate. Sometimes the cause of this condition is not known. What increases the risk? The following factors may make you more likely to develop this condition: Having a job that requires you to repeatedly or forcefully move your wrist or hand or requires you to use tools that vibrate. This may include jobs that involve using computers, working on an [employer], or working with [employer] such as Pension scheme manager. Being a woman. Having certain conditions, such as: Diabetes. Obesity. An underactive thyroid (hypothyroidism). Kidney failure. Rheumatoid arthritis. What are the signs or symptoms? Symptoms of this condition include: A tingling feeling in your fingers, especially in your thumb, index, and middle fingers. Tingling or numbness in your hand. An aching feeling in your entire arm, especially when your wrist and elbow are bent for a long time. Wrist pain that goes up your arm to your shoulder. Pain that goes  down into your palm or fingers. A weak feeling in your hands. You may have trouble grabbing and holding items. Your symptoms may feel worse during the night. How is this diagnosed? This condition is diagnosed with a medical history and physical exam. You may also have tests, including: Electromyogram (EMG). This test measures electrical signals sent by your nerves into the muscles. Nerve conduction study. This test measures how well electrical signals pass through your nerves. Imaging tests, such as X-rays, ultrasound, and MRI. These tests check for possible causes of your condition. How is this treated? This condition may be treated with: Lifestyle changes. It is important to stop or change the activity that caused your condition. Doing exercise and  activities to strengthen and stretch your muscles and tendons (physical therapy). Making lifestyle changes to help with your condition and learning how to do your daily activities safely (occupational therapy). Medicines for pain and inflammation. This may include medicine that is injected into your wrist. A wrist splint or brace. Surgery. Follow these instructions at home: If you have a splint or brace: Wear the splint or brace as told by your health care provider. Remove it only as told by your health care provider. Loosen the splint or brace if your fingers tingle, become numb, or turn cold and blue. Keep the splint or brace clean. If the splint or brace is not waterproof: Do not let it get wet. Cover it with a watertight covering when you take a bath or shower. Managing pain, stiffness, and swelling If directed, put ice on the painful area. To do this: If you have a removeable splint or brace, remove it as told by your health care provider. Put ice in a plastic bag. Place a towel between your skin and the bag or between the splint or brace and the bag. Leave the ice on for 20 minutes, 2-3 times a day. Do not fall asleep with the cold pack on your skin. Remove the ice if your skin turns bright red. This is very important. If you cannot feel pain, heat, or cold, you have a greater risk of damage to the area. Move your fingers often to reduce stiffness and swelling. General instructions Take over-the-counter and prescription medicines only as told by your health care provider. Rest your wrist and hand from any activity that may be causing your pain. If your condition is work related, talk with your employer about changes that can be made, such as getting a wrist pad to use while typing. Do any exercises as told by your health care provider, physical therapist, or occupational therapist. Keep all follow-up visits. This is important. Contact a health care provider if: You have new  symptoms. Your pain is not controlled with medicines. Your symptoms get worse. Get help right away if: You have severe numbness or tingling in your wrist or hand. Summary Carpal tunnel syndrome is a condition that causes pain, numbness, and weakness in your hand and fingers. It is usually caused by repeated wrist motions. Lifestyle changes and medicines are used to treat carpal tunnel syndrome. Surgery may be recommended. Follow your health care provider's instructions about wearing a splint, resting from activity, keeping follow-up visits, and calling for help. This information is not intended to replace advice given to you by your health care provider. Make sure you discuss any questions you have with your health care provider. Document Revised: 10/24/2019 Document Reviewed: 10/24/2019 Elsevier Patient Education  [employer].

## 2022-04-26 NOTE — Progress Notes (Signed)
Subjective  CC:  Chief Complaint  Patient presents with   Wrist Pain    Pt sttaed that she has been having wrist pain for the past 2 weeks and now her fingers are numb and tingly    HPI: Evelyn Chase is a 51 y.o. female who presents to the office today to address the problems listed above in the chief complaint. Bilateral wrist pain and numbness and tingling off and on for years but over the last two weeks, much worse: c/o wrist pain with frequent numbness and tingling into her fingers. Works at [employer] all day, crochet's for hobby; awakening at night with sxs. No weakness. No elbow or neck pain. Has been wearing CTS braces for last 2 weeks and taking nsaids w/o improvement. No h/o EMG/NCV studies. No injuries   Assessment  1. Carpal tunnel syndrome, bilateral   2. Need for shingles vaccine   3. Need for immunization against influenza      Plan  CTS bilateral:  R>L so steroid injection given on right today. Braces and ice recommended. F/u if not improved/ resolved. Can inject left if needed.  Imms: flu and 2nd shingrix today.   Follow up: Return if symptoms worsen or fail to improve.  Visit date not found  Orders Placed This Encounter  Procedures   Zoster Recombinant (Shingrix )   Flu Vaccine QUAD 4moIM (Fluarix, Fluzone & Alfiuria Quad PF)   Meds ordered this encounter  Medications   triamcinolone acetonide (KENALOG-40) injection 40 mg      I reviewed the patients updated PMH, FH, and SocHx.    Patient Active Problem List   Diagnosis Date Noted   Adenomatous polyp 11/06/2017    Priority: High   Essential hypertension 02/28/2012    Priority: High   Hepatic steatosis 01/05/2021    Priority: Medium    GERD without esophagitis 06/11/2018    Priority: Medium    Inappropriate sinus tachycardia 11/06/2017    Priority: Medium    Subclinical hypothyroidism 11/06/2017    Priority: Medium    Chronic diarrhea 11/06/2017    Priority: Medium    Obesity (BMI  30-39.9) 11/06/2017    Priority: Medium    Cough variant asthma 07/06/2007    Priority: Medium    Bilateral low frequency hearing loss 11/06/2017    Priority: Low   Seasonal and perennial allergic rhinitis 07/06/2007    Priority: Low   Vitamin B 12 deficiency 10/29/2021   Current Meds  Medication Sig   albuterol (PROAIR HFA) 108 (90 Base) MCG/ACT inhaler Inhale 2 puffs into the lungs 4 (four) times daily as needed.   Ascorbic Acid (VITAMIN C) 500 MG CAPS Take 1 tablet by mouth 2 (two) times daily.   budesonide-formoterol (SYMBICORT) 160-4.5 MCG/ACT inhaler Inhale into the lungs.   calcium carbonate (OS-CAL) 600 MG TABS tablet Take 600 mg by mouth daily.   EPINEPHrine (EPI-PEN) 0.3 mg/0.3 mL SOAJ injection Inject into thigh for severe allergic reaction   ivabradine (CORLANOR) 5 MG TABS tablet Take 1 tablet (5 mg total) by mouth 2 (two) times daily with a meal.   losartan (COZAAR) 100 MG tablet Take 1 tablet (100 mg total) by mouth daily.   metoprolol (TOPROL-XL) 200 MG 24 hr tablet Take 1 tablet (200 mg total) by mouth daily.   Multiple Vitamin (MULTIVITAMIN) tablet Take 1 tablet by mouth daily.   omeprazole (PRILOSEC) 20 MG capsule TAKE 1 CAPSULE DAILY OFFICE VISIT NEEDED   vitamin B-12 (CYANOCOBALAMIN) 1000  MCG tablet Take 1,000 mcg by mouth daily.    Allergies: Patient is allergic to codeine. Family History: Patient family history includes Arthritis in her father; Asthma in her father; COPD in her father; Dementia in her mother; Diabetes in her father; Emphysema in her father; Heart attack in her father; Heart disease in her father; Hyperlipidemia in her father; Hypertension in her father; Lung cancer (age of onset: 70) in her mother; Tuberculosis in her father. Social History:  Patient  reports that she has never smoked. She has never used smokeless tobacco. She reports current alcohol use. She reports that she does not use drugs.  Review of Systems: Constitutional: Negative for  fever malaise or anorexia Cardiovascular: negative for chest pain Respiratory: negative for SOB or persistent cough Gastrointestinal: negative for abdominal pain  Objective  Vitals: BP (!) 156/90   Pulse 79   Temp 98.2 F (36.8 C)   Ht '5\' 3"'$  (1.6 m)   Wt 165 lb 6.4 oz (75 kg)   SpO2 95%   BMI 29.30 kg/m  General: no acute distress , A&Ox3 Bilateral wrists: nl appearing. + phalen's and tinnels, nl ROM> normal grip  Carpal Tunnel Injection Procedure Note  Pre-operative Diagnosis: right carpal tunnel syndrome  Post-operative Diagnosis: same  Indications: failure of conservative therapy  Procedure Details  Verbal consent for the procedure was obtained. After a sterile alcohol prep, the skin was anesthetized with ethyl chloride cold spray.  A 25 ga 1.5" needle was advanced into the carpal tunnel space, carefully avoiding the nerve.  Free flow into the carpal tunnel space was confirmed.  The tunnel was then injected with 0.5 ml of triamcinolone (KENALOG) '40mg'$ /ml.  The area was cleansed with isopropyl alcohol and a dressing was applied.  Complications:  None; patient tolerated the procedure well.    Commons side effects, risks, benefits, and alternatives for medications and treatment plan prescribed today were discussed, and the patient expressed understanding of the given instructions. Patient is instructed to call or message via MyChart if he/she has any questions or concerns regarding our treatment plan. No barriers to understanding were identified. We discussed Red Flag symptoms and signs in detail. Patient expressed understanding regarding what to do in case of urgent or emergency type symptoms.  Medication list was reconciled, printed and provided to the patient in AVS. Patient instructions and summary information was reviewed with the patient as documented in the AVS. This note was prepared with assistance of Dragon voice recognition software. Occasional wrong-word or sound-a-like  substitutions may have occurred due to the inherent limitations of voice recognition software  This visit occurred during the SARS-CoV-2 public health emergency.  Safety protocols were in place, including screening questions prior to the visit, additional usage of staff PPE, and extensive cleaning of exam room while observing appropriate contact time as indicated for disinfecting solutions.

## 2022-05-05 ENCOUNTER — Encounter: Payer: Self-pay | Admitting: Family Medicine

## 2022-07-20 DIAGNOSIS — Z1231 Encounter for screening mammogram for malignant neoplasm of breast: Secondary | ICD-10-CM | POA: Diagnosis not present

## 2022-07-20 LAB — HM MAMMOGRAPHY

## 2022-07-29 ENCOUNTER — Other Ambulatory Visit: Payer: Self-pay | Admitting: Family Medicine

## 2022-07-29 DIAGNOSIS — I10 Essential (primary) hypertension: Secondary | ICD-10-CM

## 2022-11-10 ENCOUNTER — Other Ambulatory Visit: Payer: Self-pay | Admitting: Family Medicine

## 2022-11-30 ENCOUNTER — Other Ambulatory Visit: Payer: Self-pay | Admitting: Family Medicine

## 2023-05-05 IMAGING — US US ABDOMEN LIMITED
1 series · 14 of 25 positions shown · non-contrast
Comparison: None.

CLINICAL DATA: Elevated LFTs, remote cholecystectomy

EXAM:
ULTRASOUND ABDOMEN LIMITED RIGHT UPPER QUADRANT

[Series 1: us abdomen limited · 0.25mm/px · 14 of 33 slices shown]
[im 1/33]
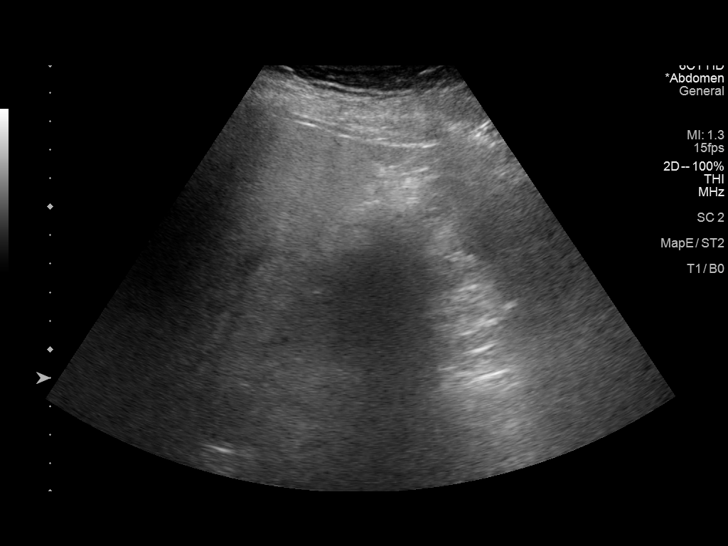
[im 3/33]
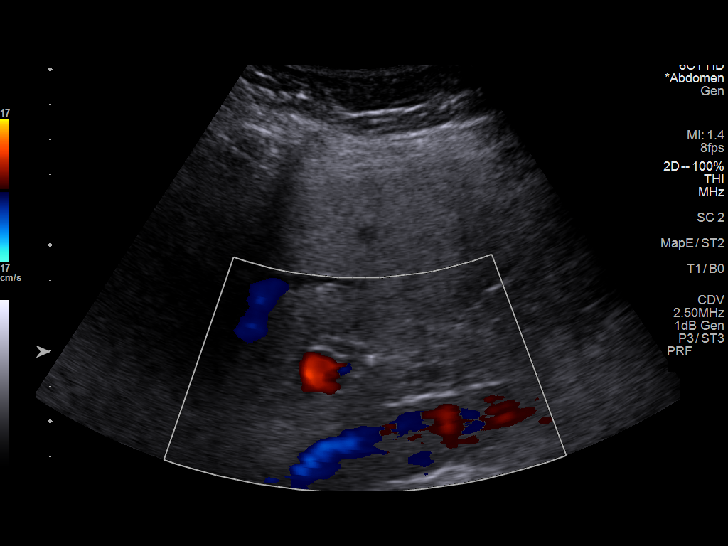
[im 6/33]
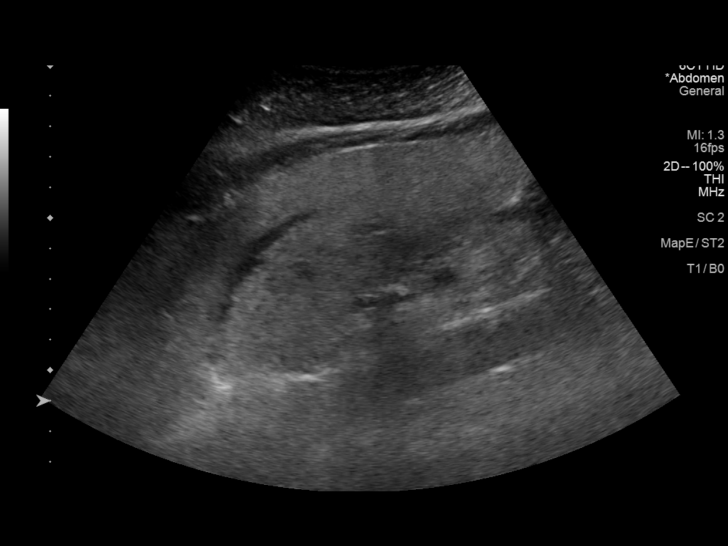
[im 9/33]
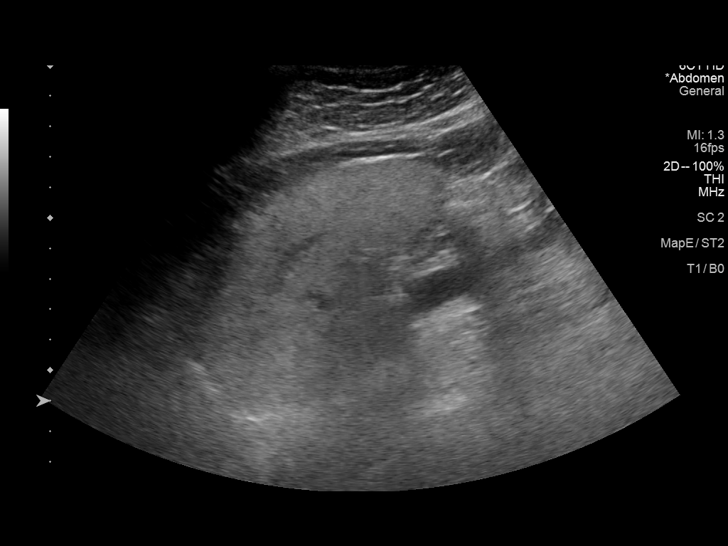
[im 11/33]
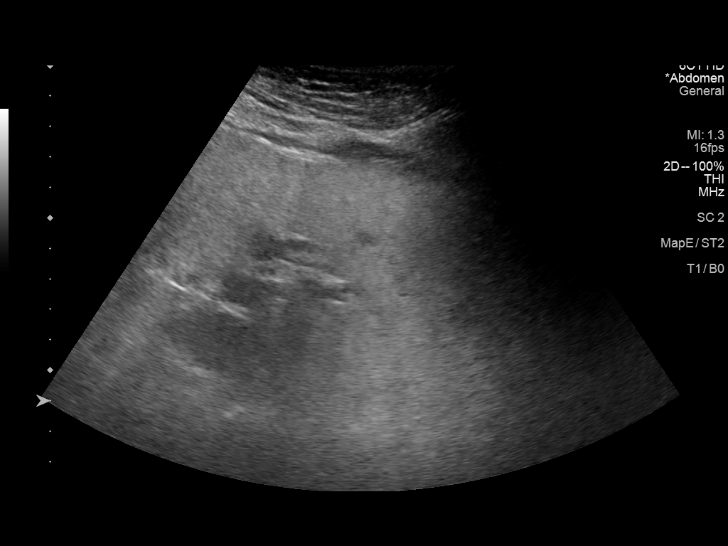
[im 13/33]
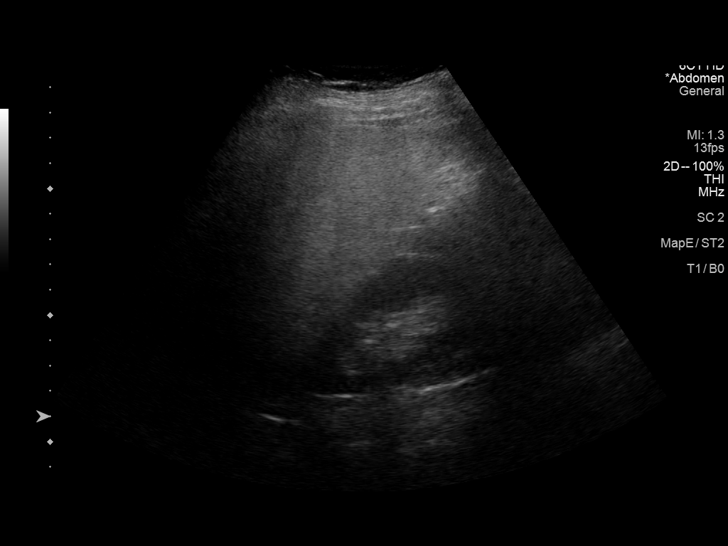
[im 15/33]
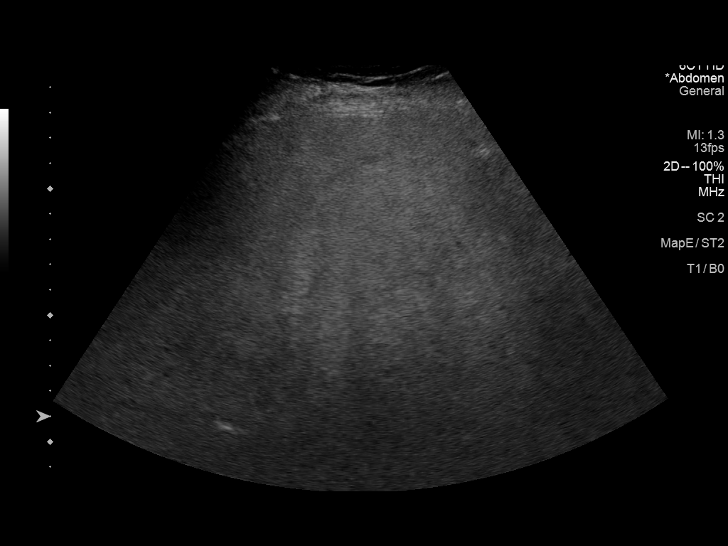
[im 18/33]
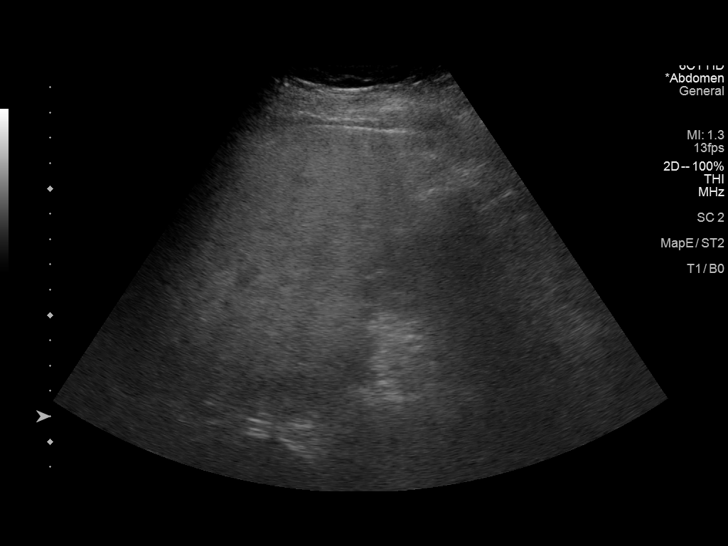
[im 21/33]
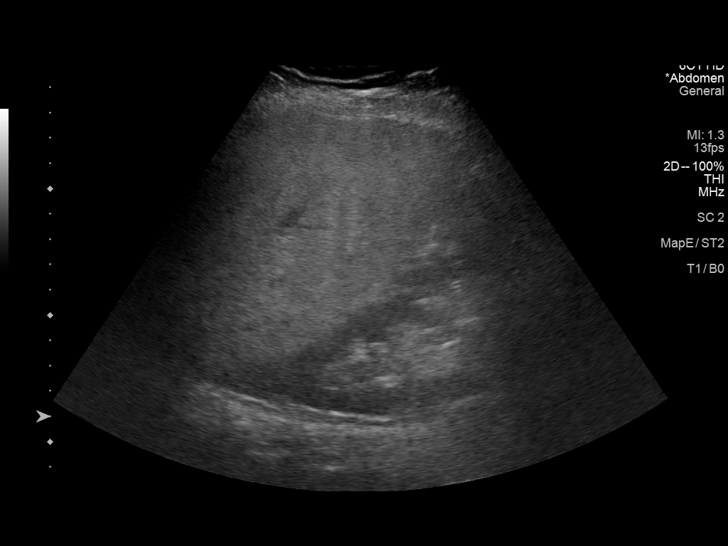
[im 22/33]
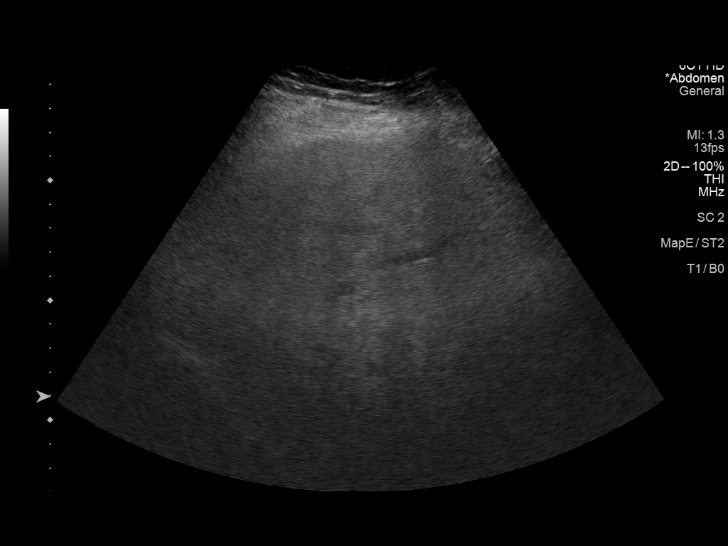
[im 25/33]
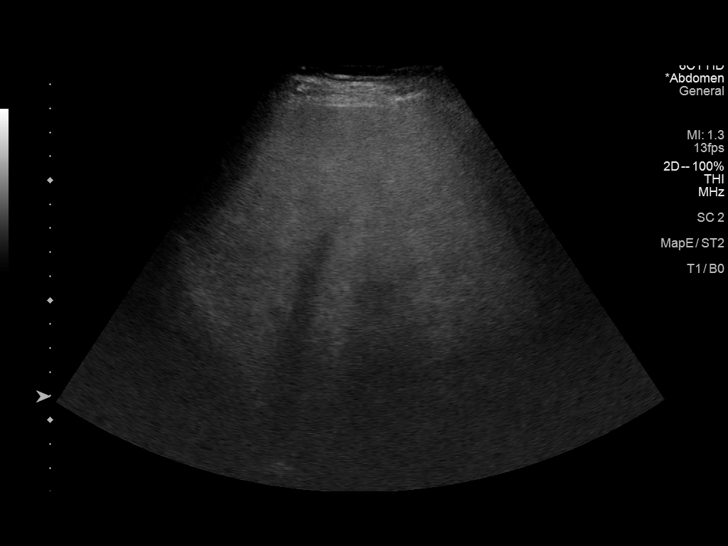
[im 27/33]
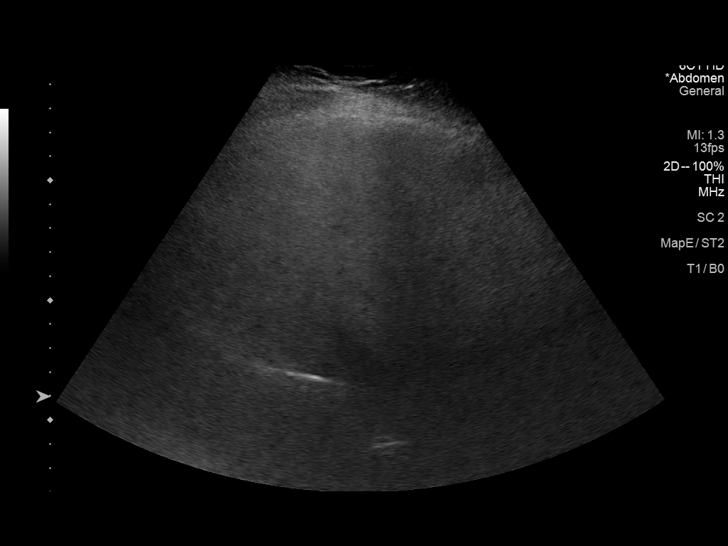
[im 30/33]
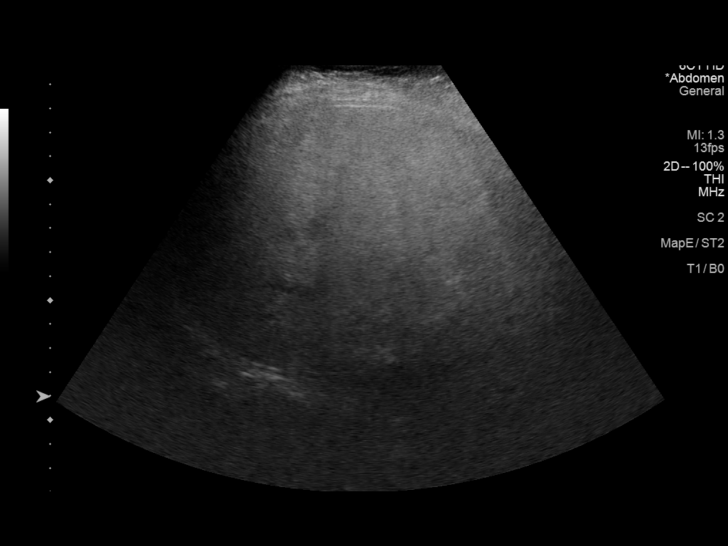
[im 33/33]
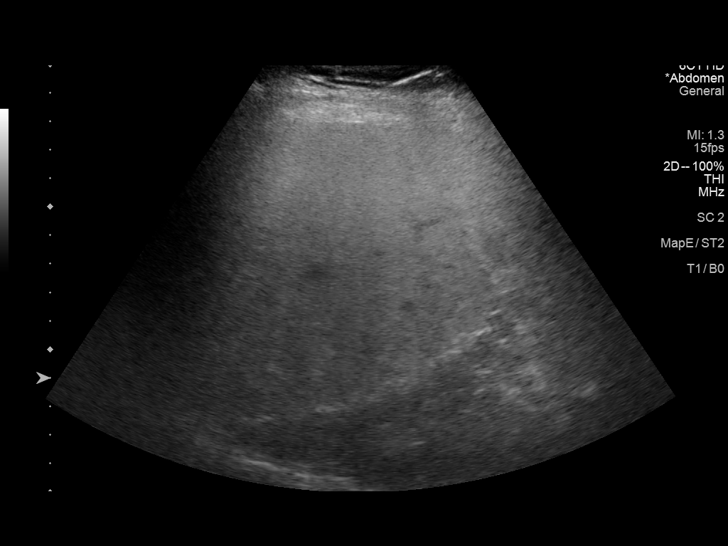

[14 of 25 positions shown; findings below may reference images not displayed]

FINDINGS: Gallbladder:

Surgically absent

Common bile duct:

Diameter: 4.9 mm

Liver:

Increased hepatic echogenicity compatible with hepatic steatosis. No
intrahepatic biliary dilatation or large focal hepatic abnormality.
Portal vein is patent on color Doppler imaging with normal direction
of blood flow towards the liver.

Other: No free fluid or ascites
IMPRESSION: Remote cholecystectomy

Hepatic steatosis

## 2023-07-25 LAB — HM MAMMOGRAPHY

## 2023-07-26 ENCOUNTER — Encounter: Payer: Self-pay | Admitting: Family Medicine

## 2023-08-05 ENCOUNTER — Other Ambulatory Visit: Payer: Self-pay | Admitting: Family Medicine

## 2023-08-05 DIAGNOSIS — I10 Essential (primary) hypertension: Secondary | ICD-10-CM

## 2023-11-05 ENCOUNTER — Other Ambulatory Visit: Payer: Self-pay | Admitting: Family Medicine

## 2023-11-22 ENCOUNTER — Other Ambulatory Visit: Payer: Self-pay | Admitting: Family Medicine

## 2023-12-02 IMAGING — DX DG ABDOMEN 2V
2 series · 2 of 2 positions shown · non-contrast
Comparison: None.

CLINICAL DATA: Constipation

EXAM:
ABDOMEN - 2 VIEW

[dg abd 2 views (1 of 2)]
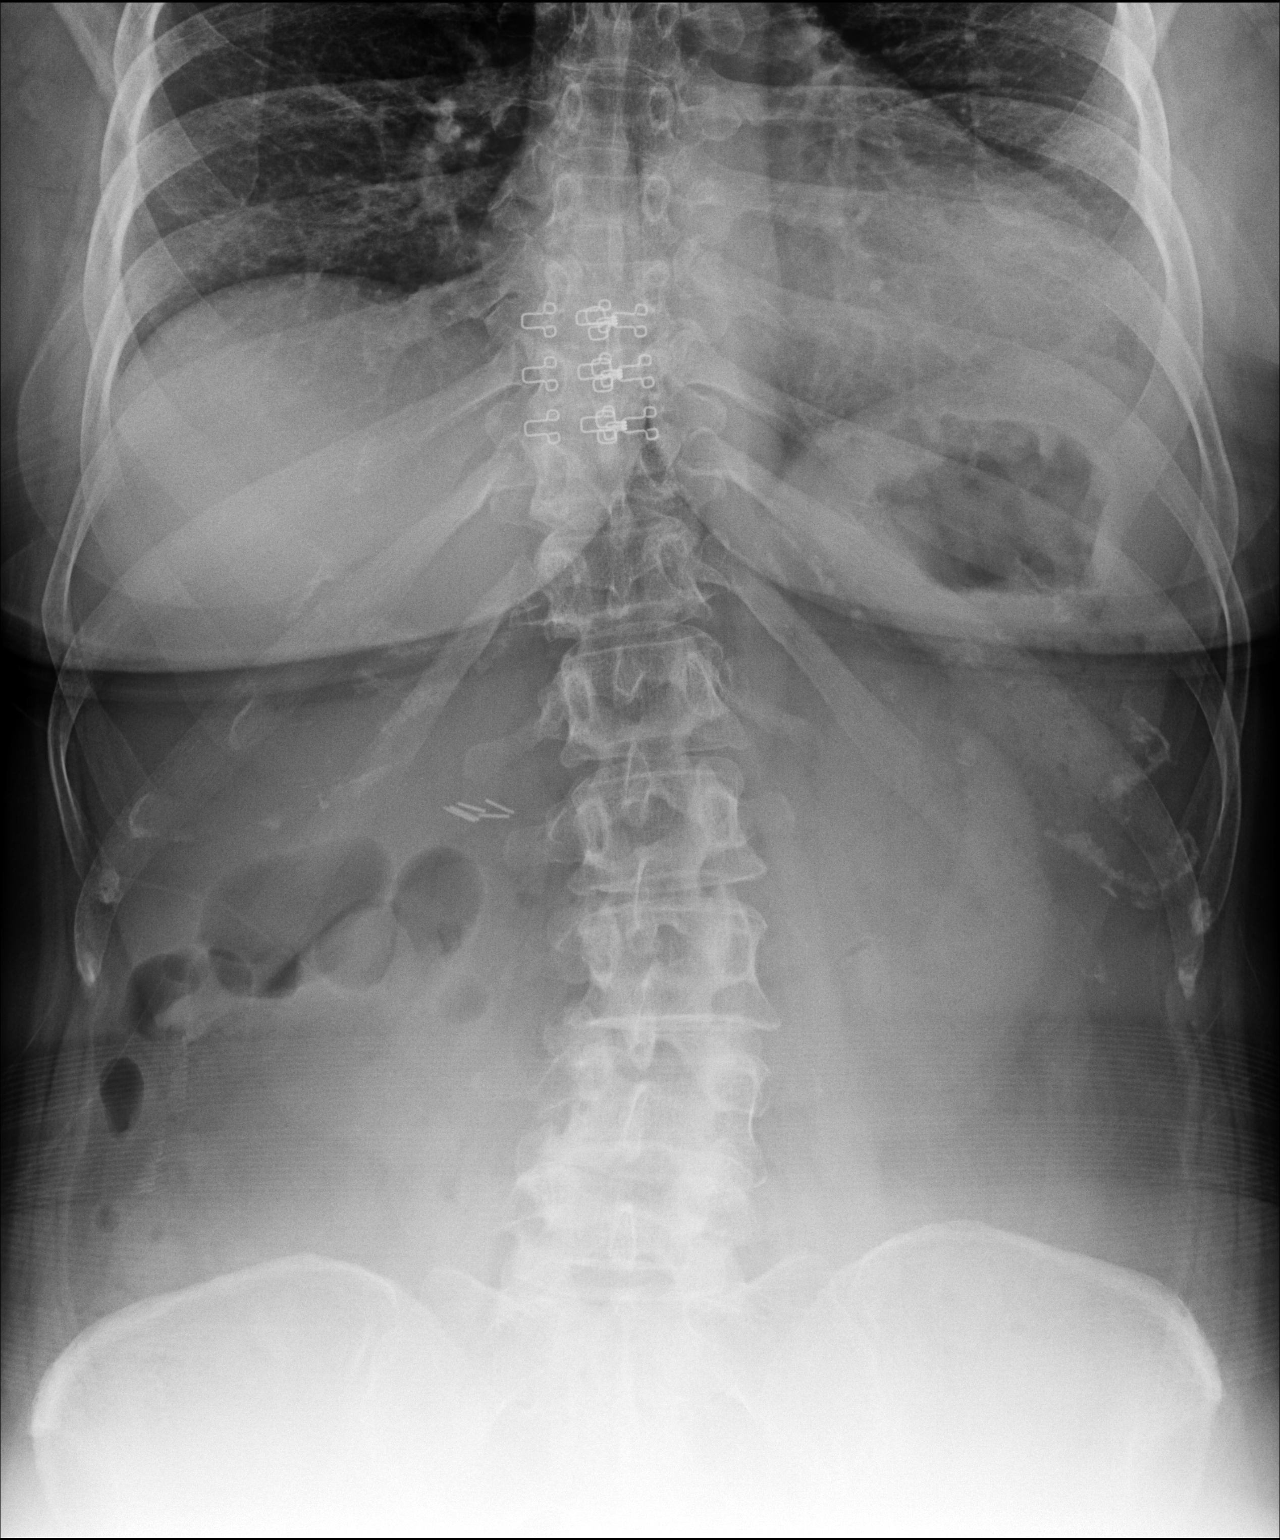

[dg abd 2 views (2 of 2)]
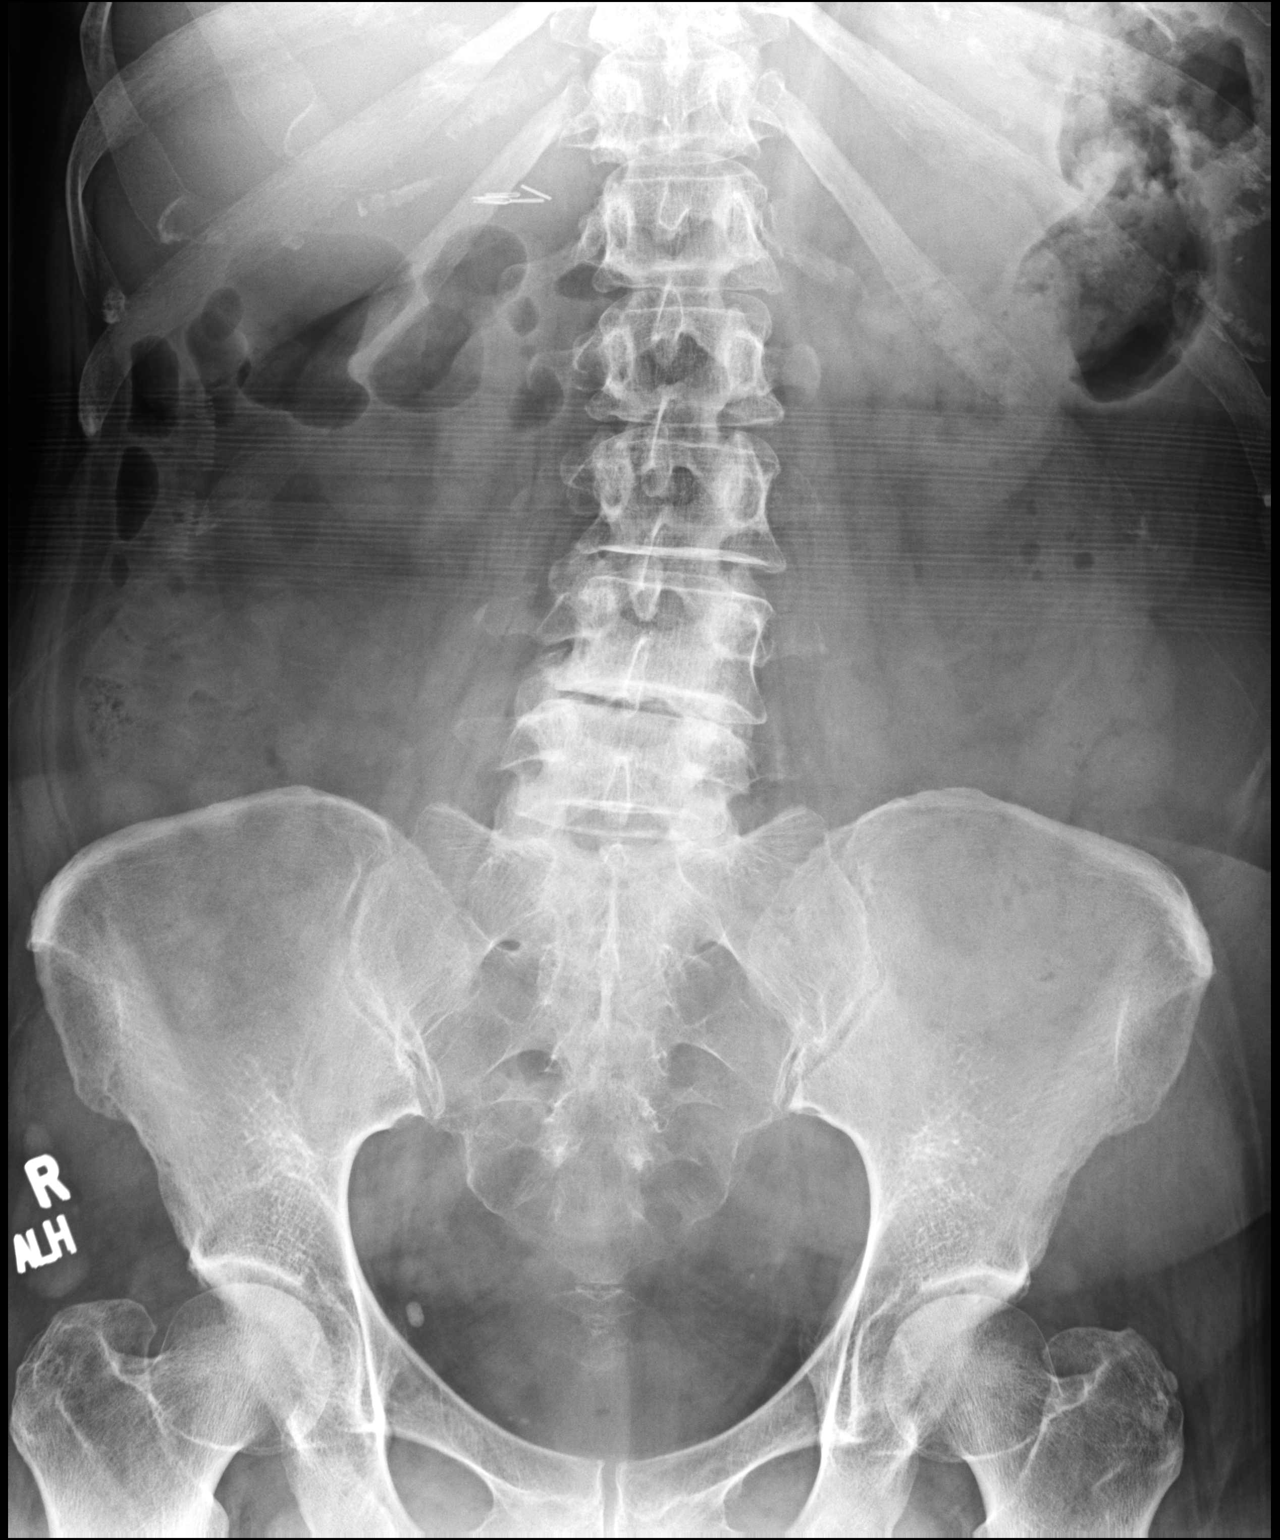

[2 of 2 positions shown; findings below may reference images not displayed]

FINDINGS: Bowel gas pattern is nonspecific. Small amount of stool is seen in
the ascending and transverse colon. There is no fecal impaction in
the rectosigmoid. Surgical clips are seen in the right upper
quadrant. No abnormal masses or calcifications are seen. Kidneys are
partly obscured by bowel contents. Degenerative changes are noted in
the lumbar spine, particularly severe at L4-L5 level.
IMPRESSION: Nonspecific bowel gas pattern. Small stool burden in the ascending
and transverse colon without signs of fecal impaction in the
rectosigmoid.
